# Patient Record
Sex: Female | Born: 1965 | Race: Black or African American | Hispanic: No | Marital: Single | Smoking: Current some day smoker
Health system: Southern US, Community
[De-identification: ages and names within clinical notes are randomized; demographics above are authoritative.]

## PROBLEM LIST (undated history)

## (undated) DIAGNOSIS — I639 Cerebral infarction, unspecified: Secondary | ICD-10-CM

## (undated) DIAGNOSIS — E119 Type 2 diabetes mellitus without complications: Secondary | ICD-10-CM

## (undated) HISTORY — PX: CHOLECYSTECTOMY: SHX55

---

## 2009-08-01 DIAGNOSIS — I2510 Atherosclerotic heart disease of native coronary artery without angina pectoris: Secondary | ICD-10-CM | POA: Diagnosis present

## 2009-08-12 DIAGNOSIS — E785 Hyperlipidemia, unspecified: Secondary | ICD-10-CM | POA: Diagnosis present

## 2010-03-17 DIAGNOSIS — E119 Type 2 diabetes mellitus without complications: Secondary | ICD-10-CM

## 2014-02-14 DIAGNOSIS — I10 Essential (primary) hypertension: Secondary | ICD-10-CM | POA: Diagnosis present

## 2014-09-18 DIAGNOSIS — E1142 Type 2 diabetes mellitus with diabetic polyneuropathy: Secondary | ICD-10-CM | POA: Diagnosis present

## 2014-09-18 DIAGNOSIS — K219 Gastro-esophageal reflux disease without esophagitis: Secondary | ICD-10-CM | POA: Diagnosis present

## 2017-10-06 ENCOUNTER — Emergency Department
Admission: EM | Admit: 2017-10-06 | Discharge: 2017-10-06 | Disposition: A | Discharge status: Home / Self Care | Payer: Medicaid Other | Attending: Emergency Medicine | Admitting: Emergency Medicine

## 2017-10-06 ENCOUNTER — Emergency Department: Payer: Medicaid Other

## 2017-10-06 ENCOUNTER — Other Ambulatory Visit: Payer: Self-pay

## 2017-10-06 DIAGNOSIS — Z79899 Other long term (current) drug therapy: Secondary | ICD-10-CM | POA: Insufficient documentation

## 2017-10-06 DIAGNOSIS — Z7984 Long term (current) use of oral hypoglycemic drugs: Secondary | ICD-10-CM | POA: Diagnosis not present

## 2017-10-06 DIAGNOSIS — R569 Unspecified convulsions: Secondary | ICD-10-CM | POA: Diagnosis not present

## 2017-10-06 DIAGNOSIS — R4182 Altered mental status, unspecified: Secondary | ICD-10-CM | POA: Insufficient documentation

## 2017-10-06 DIAGNOSIS — R000 Tachycardia, unspecified: Secondary | ICD-10-CM | POA: Diagnosis not present

## 2017-10-06 DIAGNOSIS — Z8673 Personal history of transient ischemic attack (TIA), and cerebral infarction without residual deficits: Secondary | ICD-10-CM | POA: Insufficient documentation

## 2017-10-06 DIAGNOSIS — Z794 Long term (current) use of insulin: Secondary | ICD-10-CM | POA: Diagnosis not present

## 2017-10-06 DIAGNOSIS — I10 Essential (primary) hypertension: Secondary | ICD-10-CM | POA: Insufficient documentation

## 2017-10-06 DIAGNOSIS — Z7902 Long term (current) use of antithrombotics/antiplatelets: Secondary | ICD-10-CM | POA: Insufficient documentation

## 2017-10-06 LAB — COMPREHENSIVE METABOLIC PANEL
ALK PHOS: 79 U/L (ref 38–126)
ALT: 22 U/L (ref 14–54)
ANION GAP: 11 (ref 5–15)
AST: 29 U/L (ref 15–41)
Albumin: 4.6 g/dL (ref 3.5–5.0)
BILIRUBIN TOTAL: 0.4 mg/dL (ref 0.3–1.2)
BUN: 14 mg/dL (ref 6–20)
CALCIUM: 10 mg/dL (ref 8.9–10.3)
CO2: 22 mmol/L (ref 22–32)
Chloride: 103 mmol/L (ref 101–111)
Creatinine, Ser: 1.05 mg/dL — ABNORMAL HIGH (ref 0.44–1.00)
GFR calc Af Amer: >60 mL/min (ref >60)
GFR calc non Af Amer: >60 mL/min (ref >60)
Glucose, Bld: 333 mg/dL — ABNORMAL HIGH (ref 65–99)
Potassium: 4.7 mmol/L (ref 3.5–5.1)
SODIUM: 136 mmol/L (ref 135–145)
TOTAL PROTEIN: 8.2 g/dL — AB (ref 6.5–8.1)

## 2017-10-06 LAB — URINALYSIS, ROUTINE W REFLEX MICROSCOPIC
BILIRUBIN URINE: NEGATIVE
Bacteria, UA: NONE SEEN
Hgb urine dipstick: NEGATIVE
KETONES UR: NEGATIVE mg/dL
LEUKOCYTES UA: NEGATIVE
NITRITE: NEGATIVE
PH: 6 (ref 5.0–8.0)
Protein, ur: NEGATIVE mg/dL
Specific Gravity, Urine: 1.016 (ref 1.005–1.030)

## 2017-10-06 LAB — URINE DRUG SCREEN, QUALITATIVE (ARMC ONLY)
Amphetamines, Ur Screen: NOT DETECTED
BARBITURATES, UR SCREEN: NOT DETECTED
BENZODIAZEPINE, UR SCRN: NOT DETECTED
Cannabinoid 50 Ng, Ur ~~LOC~~: NOT DETECTED
Cocaine Metabolite,Ur ~~LOC~~: NOT DETECTED
MDMA (Ecstasy)Ur Screen: NOT DETECTED
METHADONE SCREEN, URINE: NOT DETECTED
Opiate, Ur Screen: NOT DETECTED
Phencyclidine (PCP) Ur S: NOT DETECTED
Tricyclic, Ur Screen: POSITIVE — AB

## 2017-10-06 LAB — APTT: aPTT: 26 seconds (ref 24–36)

## 2017-10-06 LAB — DIFFERENTIAL
Basophils Absolute: 0.1 10*3/uL (ref 0–0.1)
Basophils Relative: 0 %
EOS ABS: 0.1 10*3/uL (ref 0–0.7)
Eosinophils Relative: 1 %
LYMPHS PCT: 16 %
Lymphs Abs: 2.6 10*3/uL (ref 1.0–3.6)
MONO ABS: 1 10*3/uL — AB (ref 0.2–0.9)
Monocytes Relative: 6 %
Neutro Abs: 12.9 10*3/uL — ABNORMAL HIGH (ref 1.4–6.5)
Neutrophils Relative %: 77 %

## 2017-10-06 LAB — PROTIME-INR
INR: 0.86
PROTHROMBIN TIME: 11.6 s (ref 11.4–15.2)

## 2017-10-06 LAB — CBC
HCT: 37 % (ref 35.0–47.0)
Hemoglobin: 12.2 g/dL (ref 12.0–16.0)
MCH: 27.4 pg (ref 26.0–34.0)
MCHC: 33 g/dL (ref 32.0–36.0)
MCV: 83 fL (ref 80.0–100.0)
PLATELETS: 474 10*3/uL — AB (ref 150–440)
RBC: 4.45 MIL/uL (ref 3.80–5.20)
RDW: 13 % (ref 11.5–14.5)
WBC: 16.6 10*3/uL — ABNORMAL HIGH (ref 3.6–11.0)

## 2017-10-06 LAB — TROPONIN I: Troponin I: <0.03 ng/mL (ref <0.03)

## 2017-10-06 MED ORDER — LEVETIRACETAM 500 MG/5ML IV SOLN
1000.0000 mg | Freq: Once | INTRAVENOUS | Status: AC
  Administered 2017-10-06: 1000 mg via INTRAVENOUS
  Filled 2017-10-06: qty 10

## 2017-10-06 MED ORDER — LEVETIRACETAM 500 MG PO TABS: 500.0000 mg | ORAL_TABLET | Freq: Two times a day (BID) | ORAL | 0 refills | Status: DC

## 2017-10-06 NOTE — ED Notes (Addendum)
Pt states "I know I said I would stay for observation but I want to leave when this medication is done." Pt states she does not want to be transferred and will be willing to sign and leave against medical advice. MD made aware.

## 2017-10-06 NOTE — ED Notes (Signed)
Pt assisted to toilet and [facility]. Pt is still confused. MD aware

## 2017-10-06 NOTE — ED Notes (Signed)
Waiting on medication from pharmacy

## 2017-10-06 NOTE — ED Notes (Signed)
MD Kinner at bedside  

## 2017-10-06 NOTE — ED Provider Notes (Signed)
[facility] Emergency Department Provider Note   ____________________________________________    I have reviewed the triage vital signs and the nursing notes.   HISTORY  Chief Complaint Altered mental status  Patient is unable to answer questions, due to altered mental status  HPI Linda Delacruz is a 52 y.o. female who presents after likely seizure.  Per EMS patient was at work and witnesses saw her have a generalized tonic-clonic seizure, she was mildly confused but was able to speak to them in the EMS truck but then had another seizure.  Reportedly the patient had a stroke several months ago.  Patient is unable to answer any questions she is quite confused   No past medical history on file.  There are no active problems to display for this patient.     Prior to Admission medications   Medication Sig Start Date End Date Taking? Authorizing Provider  amLODipine (NORVASC) 10 MG tablet Take 10 mg by mouth daily. 03/24/17  Yes [provider]  clopidogrel (PLAVIX) 75 MG tablet Take 75 mg by mouth daily. 06/21/17 06/21/18 Yes [provider]  DULoxetine (CYMBALTA) 60 MG capsule Take 60 mg by mouth daily. 03/24/17  Yes [provider]  insulin aspart (NOVOLOG FLEXPEN) 100 UNIT/ML FlexPen Inject 15 Units into the skin 3 (three) times daily with meals. 06/20/17  Yes [provider]  Insulin Glargine (LANTUS SOLOSTAR) 100 UNIT/ML Solostar Pen Inject 65 Units into the skin every evening. 06/20/17 06/20/18 Yes [provider]  lisinopril (PRINIVIL,ZESTRIL) 5 MG tablet Take 5 mg by mouth daily. 03/29/17 03/29/18 Yes [provider]  lovastatin (MEVACOR) 40 MG tablet Take 40 mg by mouth daily.   Yes [provider]  metFORMIN (GLUCOPHAGE) 500 MG tablet Take 500 mg by mouth 2 (two) times daily. 03/24/17  Yes [provider]  metoprolol succinate (TOPROL-XL) 25 MG 24 hr tablet Take 1 tablet by mouth  daily. 03/24/17 03/24/18 Yes [provider]  nortriptyline (PAMELOR) 10 MG capsule Take 1 capsule by mouth at bedtime. 03/24/17 03/24/18 Yes [provider]  traZODone (DESYREL) 50 MG tablet Take 50 mg by mouth at bedtime as needed for sleep.   Yes [provider]     Allergies Patient has no known allergies.  No family history on file.  Social History Social History   Tobacco Use  . Smoking status: Not on file  Substance Use Topics  . Alcohol use: Not on file  . Drug use: Not on file    Review of Systems  Constitutional: No fever/chills Eyes: No visual changes.  ENT: No sore throat. Cardiovascular: Denies chest pain. Respiratory: Denies shortness of breath. Gastrointestinal: No abdominal pain.  No nausea, no vomiting.   Genitourinary: Negative for dysuria. Musculoskeletal: Negative for back pain. Skin: Negative for rash. Neurological: Negative for headaches or weakness   ____________________________________________   PHYSICAL EXAM:  VITAL SIGNS: ED Triage Vitals  Enc Vitals Group     BP 10/06/17 1243 (!) 172/89     Pulse Rate 10/06/17 1243 100     Resp 10/06/17 1243 (!) 21     Temp 10/06/17 1243 98.1 F (36.7 C)     Temp Source 10/06/17 1243 Axillary     SpO2 10/06/17 1243 100 %     Weight 10/06/17 1247 74.8 kg (165 lb)     Height 10/06/17 1247 1.651 m (5\' 5" )     Head Circumference --      Peak Flow --  Pain Score --      Pain Loc --      Pain Edu? --      Excl. in GC? --     Constitutional: Patient confused, not answering questions Eyes: Conjunctivae are normal.   Nose: No congestion/rhinnorhea. Mouth/Throat: Mucous membranes are moist.    Cardiovascular: Tachycardia grossly normal heart sounds.  Good peripheral circulation. Respiratory: Normal respiratory effort.  No retractions. Lungs CTAB. Gastrointestinal: Soft and nontender. No distention.  No CVA tenderness. Genitourinary: deferred Musculoskeletal:   Warm and  well perfused Neurologic: Patient looking off to the right, appears to move all extremities equally, PERRLA Skin:  Skin is warm, dry and intact. No rash noted. Psychiatric: unAble to examine  ____________________________________________   LABS (all labs ordered are listed, but only abnormal results are displayed)  Labs Reviewed  CBC - Abnormal; Notable for the following components:      Result Value   WBC 16.6 (*)    Platelets 474 (*)    All other components within normal limits  DIFFERENTIAL - Abnormal; Notable for the following components:   Neutro Abs 12.9 (*)    Monocytes Absolute 1.0 (*)    All other components within normal limits  COMPREHENSIVE METABOLIC PANEL - Abnormal; Notable for the following components:   Glucose, Bld 333 (*)    Creatinine, Ser 1.05 (*)    Total Protein 8.2 (*)    All other components within normal limits  URINE DRUG SCREEN, QUALITATIVE ([facility] ONLY) - Abnormal; Notable for the following components:   Tricyclic, Ur Screen POSITIVE (*)    All other components within normal limits  URINALYSIS, ROUTINE W REFLEX MICROSCOPIC - Abnormal; Notable for the following components:   Color, Urine YELLOW (*)    APPearance CLOUDY (*)    Glucose, UA >=500 (*)    Squamous Epithelial / LPF 0-5 (*)    All other components within normal limits  PROTIME-INR  APTT  TROPONIN I   ____________________________________________  EKG  ED ECG REPORT I, Jene Every, the attending physician, personally viewed and interpreted this ECG.  Date: 10/06/2017  Rhythm: Sinus tachycardia QRS Axis: normal Intervals: normal ST/T Wave abnormalities: normal Narrative Interpretation: no evidence of acute ischemia  ____________________________________________  RADIOLOGY  CT head unremarkable ____________________________________________   PROCEDURES  Procedure(s) performed: No  Procedures   Critical Care performed:  No ____________________________________________   INITIAL IMPRESSION / ASSESSMENT AND PLAN / ED COURSE  Pertinent labs & imaging results that were available during my care of the patient were reviewed by me and considered in my medical decision making (see chart for details).  Patient with new onset seizure, CT scan unremarkable, lab work overall reassuring, mildly elevated white blood cell count likely related to seizure.  Discussed with patient admission to the hospital for observation and she agrees    ----------------------------------------- 2:54 PM on 10/06/2017 -----------------------------------------  Patient is now requesting to leave AMA.  She notes that she "feels fine "does not want to stay in the hospital.  I discussed with her that I think leaving is dangerous because she can have another seizure at any time which could leave her maimed or even dead.  She accepts this responsibility.  She knows she can return anytime  10/06/2017 at 3:03 PM:  The patient requested to leave.  I considered this to be leaving against medical advice. I personally discussed the following with them:  1)  That they currently had a medical condition of seizure and I am concerned that  they may have another seizure.    2)  My proposed course of evaluation and treatment includes, but is not limited to, IV Keppra and admission.    3) Risks of leaving before this had been completed include: misdiagnosis, worsening illness leading up to and including prolonged or permanent disability or death.  Specific risks pertinent, but not all inclusive, of their current medical condition include but are not limited to seizure, aspiration, head injury.    Despite this they stated they wanted to leave due to feeling better and generally not wanting to stay in the hospital and refused further evaluation, treatment, or admission at this time.   They appeared clinically sober, were mentating appropriately, were free  from distracting injury, had adequately controlled acute pain, appeared to have intact insight, judgment, and reason, and in my opinion had the capacity to make this decision.  Specifically, they were able to verbally state back in a coherent manner their current medical condition/current diagnosis, the proposed course of evaluation and/or treatment, and the risks, benefits, and alternatives of treatment versus leaving against medical advice.   They understand that they may return to seek medical attention here at ANY time they want.  I strongly advised them to return to the Emergency Department immediately if they experience any new or worsening symptoms that concern them, or simply if they reconsider continued evaluation and/or treatment as previously discussed.  This would be without any repercussions, though they understand they likely will need to wait again in the Emergency Department if other patients are in front of them, rather than being brought straight back.  They understood this is another advantage of staying, but still insisted upon leaving.  I recommended they follow-up with neurology at the earliest available opportunity/appointment for further evaluation and treatment.   The patient was discharged against medical advice.  They did accept written discharge instructions.      ____________________________________________   FINAL CLINICAL IMPRESSION(S) / ED DIAGNOSES  Final diagnoses:  Seizure (HCC)        Note:  This document was prepared using Dragon voice recognition software and may include unintentional dictation errors.    Jene Every, MD 10/06/17 1504

## 2017-10-06 NOTE — ED Triage Notes (Signed)
Pt comes via [facility] from work following a seizure. Pt was witnessed by coworkers having a seizure. EMS states pt did have full seizure with them for about 10-15s. Pt is altered at this time with right sided gaze. BS- 345, BP 190/100. Pt is alert but mumbling at this time

## 2017-10-06 NOTE — Progress Notes (Signed)
Chaplain was referred by on call chaplain for a code stroke. Doctor said if was a mistake  No family was present. Pt went to CT. Chaplain informed front dest that family can go back.    10/06/17 1300  Clinical Encounter Type  Visited With Patient  Visit Type Initial  Referral From Nurse  Spiritual Encounters  Spiritual Needs Emotional

## 2018-12-11 DIAGNOSIS — Z8673 Personal history of transient ischemic attack (TIA), and cerebral infarction without residual deficits: Secondary | ICD-10-CM

## 2020-10-20 DIAGNOSIS — F339 Major depressive disorder, recurrent, unspecified: Secondary | ICD-10-CM | POA: Diagnosis present

## 2021-04-03 ENCOUNTER — Emergency Department: Payer: BLUE CROSS/BLUE SHIELD

## 2021-04-03 ENCOUNTER — Encounter: Payer: Self-pay | Admitting: Emergency Medicine

## 2021-04-03 ENCOUNTER — Observation Stay
Admission: EM | Admit: 2021-04-03 | Discharge: 2021-04-05 | Disposition: A | Discharge status: Home / Self Care | Payer: BLUE CROSS/BLUE SHIELD | Attending: Internal Medicine | Admitting: Internal Medicine

## 2021-04-03 ENCOUNTER — Other Ambulatory Visit: Payer: Self-pay

## 2021-04-03 DIAGNOSIS — Z7984 Long term (current) use of oral hypoglycemic drugs: Secondary | ICD-10-CM | POA: Insufficient documentation

## 2021-04-03 DIAGNOSIS — Z8673 Personal history of transient ischemic attack (TIA), and cerebral infarction without residual deficits: Secondary | ICD-10-CM

## 2021-04-03 DIAGNOSIS — K219 Gastro-esophageal reflux disease without esophagitis: Secondary | ICD-10-CM

## 2021-04-03 DIAGNOSIS — I10 Essential (primary) hypertension: Secondary | ICD-10-CM

## 2021-04-03 DIAGNOSIS — Z20822 Contact with and (suspected) exposure to covid-19: Secondary | ICD-10-CM | POA: Diagnosis not present

## 2021-04-03 DIAGNOSIS — I2510 Atherosclerotic heart disease of native coronary artery without angina pectoris: Secondary | ICD-10-CM | POA: Diagnosis not present

## 2021-04-03 DIAGNOSIS — E1142 Type 2 diabetes mellitus with diabetic polyneuropathy: Secondary | ICD-10-CM

## 2021-04-03 DIAGNOSIS — Z79899 Other long term (current) drug therapy: Secondary | ICD-10-CM | POA: Insufficient documentation

## 2021-04-03 DIAGNOSIS — E1165 Type 2 diabetes mellitus with hyperglycemia: Secondary | ICD-10-CM | POA: Insufficient documentation

## 2021-04-03 DIAGNOSIS — R4182 Altered mental status, unspecified: Principal | ICD-10-CM

## 2021-04-03 DIAGNOSIS — E785 Hyperlipidemia, unspecified: Secondary | ICD-10-CM

## 2021-04-03 DIAGNOSIS — F339 Major depressive disorder, recurrent, unspecified: Secondary | ICD-10-CM

## 2021-04-03 DIAGNOSIS — R29818 Other symptoms and signs involving the nervous system: Secondary | ICD-10-CM | POA: Diagnosis not present

## 2021-04-03 DIAGNOSIS — N179 Acute kidney failure, unspecified: Secondary | ICD-10-CM | POA: Insufficient documentation

## 2021-04-03 DIAGNOSIS — Y900 Blood alcohol level of less than 20 mg/100 ml: Secondary | ICD-10-CM | POA: Diagnosis not present

## 2021-04-03 DIAGNOSIS — E119 Type 2 diabetes mellitus without complications: Secondary | ICD-10-CM

## 2021-04-03 DIAGNOSIS — Z7982 Long term (current) use of aspirin: Secondary | ICD-10-CM | POA: Insufficient documentation

## 2021-04-03 DIAGNOSIS — Z794 Long term (current) use of insulin: Secondary | ICD-10-CM

## 2021-04-03 DIAGNOSIS — R739 Hyperglycemia, unspecified: Secondary | ICD-10-CM

## 2021-04-03 HISTORY — DX: Type 2 diabetes mellitus without complications: E11.9

## 2021-04-03 HISTORY — DX: Cerebral infarction, unspecified: I63.9

## 2021-04-03 LAB — TROPONIN I (HIGH SENSITIVITY): Troponin I (High Sensitivity): 7 ng/L (ref <18)

## 2021-04-03 LAB — DIFFERENTIAL
Abs Immature Granulocytes: 0.12 10*3/uL — ABNORMAL HIGH (ref 0.00–0.07)
Basophils Absolute: 0.1 10*3/uL (ref 0.0–0.1)
Basophils Relative: 1 %
Eosinophils Absolute: 0.3 10*3/uL (ref 0.0–0.5)
Eosinophils Relative: 2 %
Immature Granulocytes: 1 %
Lymphocytes Relative: 18 %
Lymphs Abs: 2.7 10*3/uL (ref 0.7–4.0)
Monocytes Absolute: 1 10*3/uL (ref 0.1–1.0)
Monocytes Relative: 7 %
Neutro Abs: 10.8 10*3/uL — ABNORMAL HIGH (ref 1.7–7.7)
Neutrophils Relative %: 71 %

## 2021-04-03 LAB — COMPREHENSIVE METABOLIC PANEL
ALT: 13 U/L (ref 0–44)
AST: 22 U/L (ref 15–41)
Albumin: 4 g/dL (ref 3.5–5.0)
Alkaline Phosphatase: 113 U/L (ref 38–126)
Anion gap: 8 (ref 5–15)
BUN: 25 mg/dL — ABNORMAL HIGH (ref 6–20)
CO2: 27 mmol/L (ref 22–32)
Calcium: 9.6 mg/dL (ref 8.9–10.3)
Chloride: 96 mmol/L — ABNORMAL LOW (ref 98–111)
Creatinine, Ser: 1.83 mg/dL — ABNORMAL HIGH (ref 0.44–1.00)
GFR, Estimated: 32 mL/min — ABNORMAL LOW (ref >60)
Glucose, Bld: 343 mg/dL — ABNORMAL HIGH (ref 70–99)
Potassium: 4.5 mmol/L (ref 3.5–5.1)
Sodium: 131 mmol/L — ABNORMAL LOW (ref 135–145)
Total Bilirubin: 0.4 mg/dL (ref 0.3–1.2)
Total Protein: 7.9 g/dL (ref 6.5–8.1)

## 2021-04-03 LAB — PROTIME-INR
INR: 0.9 (ref 0.8–1.2)
Prothrombin Time: 12 seconds (ref 11.4–15.2)

## 2021-04-03 LAB — CBC
HCT: 34.3 % — ABNORMAL LOW (ref 36.0–46.0)
Hemoglobin: 11.4 g/dL — ABNORMAL LOW (ref 12.0–15.0)
MCH: 28.2 pg (ref 26.0–34.0)
MCHC: 33.2 g/dL (ref 30.0–36.0)
MCV: 84.9 fL (ref 80.0–100.0)
Platelets: 447 10*3/uL — ABNORMAL HIGH (ref 150–400)
RBC: 4.04 MIL/uL (ref 3.87–5.11)
RDW: 12.6 % (ref 11.5–15.5)
WBC: 15 10*3/uL — ABNORMAL HIGH (ref 4.0–10.5)
nRBC: 0 % (ref 0.0–0.2)

## 2021-04-03 LAB — APTT: aPTT: 24 seconds (ref 24–36)

## 2021-04-03 LAB — ETHANOL: Alcohol, Ethyl (B): <10 mg/dL (ref <10)

## 2021-04-03 LAB — CBG MONITORING, ED
Glucose-Capillary: 329 mg/dL — ABNORMAL HIGH (ref 70–99)
Glucose-Capillary: 211 mg/dL — ABNORMAL HIGH (ref 70–99)
Glucose-Capillary: 104 mg/dL — ABNORMAL HIGH (ref 70–99)

## 2021-04-03 LAB — RESP PANEL BY RT-PCR (FLU A&B, COVID) ARPGX2
Influenza A by PCR: NEGATIVE
Influenza B by PCR: NEGATIVE
SARS Coronavirus 2 by RT PCR: NEGATIVE

## 2021-04-03 LAB — BETA-HYDROXYBUTYRIC ACID: Beta-Hydroxybutyric Acid: 0.07 mmol/L (ref 0.05–0.27)

## 2021-04-03 LAB — MAGNESIUM: Magnesium: 2.2 mg/dL (ref 1.7–2.4)

## 2021-04-03 MED ORDER — LORAZEPAM 2 MG/ML IJ SOLN: 2.0000 mg | Freq: Every day | INTRAMUSCULAR | Status: DC | PRN

## 2021-04-03 MED ORDER — ENOXAPARIN SODIUM 40 MG/0.4ML IJ SOSY
40.0000 mg | PREFILLED_SYRINGE | INTRAMUSCULAR | Status: DC
  Administered 2021-04-03 – 2021-04-04 (×2): 40 mg via SUBCUTANEOUS
  Filled 2021-04-03 (×2): qty 0.4

## 2021-04-03 MED ORDER — SODIUM CHLORIDE 0.9% FLUSH
3.0000 mL | Freq: Two times a day (BID) | INTRAVENOUS | Status: DC
  Administered 2021-04-03 – 2021-04-04 (×2): 3 mL via INTRAVENOUS

## 2021-04-03 MED ORDER — INSULIN ASPART 100 UNIT/ML IJ SOLN
0.0000 [IU] | Freq: Three times a day (TID) | INTRAMUSCULAR | Status: DC
  Administered 2021-04-04: 5 [IU] via SUBCUTANEOUS
  Administered 2021-04-05: 15 [IU] via SUBCUTANEOUS
  Filled 2021-04-03 (×3): qty 1

## 2021-04-03 MED ORDER — AMLODIPINE BESYLATE 10 MG PO TABS
10.0000 mg | ORAL_TABLET | Freq: Every day | ORAL | Status: DC
  Administered 2021-04-04 – 2021-04-05 (×2): 10 mg via ORAL
  Filled 2021-04-03 (×2): qty 1

## 2021-04-03 MED ORDER — INSULIN ASPART 100 UNIT/ML IJ SOLN
0.0000 [IU] | INTRAMUSCULAR | Status: DC
  Administered 2021-04-03: 11 [IU] via SUBCUTANEOUS
  Filled 2021-04-03: qty 1

## 2021-04-03 MED ORDER — LACTATED RINGERS IV BOLUS
1000.0000 mL | Freq: Once | INTRAVENOUS | Status: AC
  Administered 2021-04-03: 1000 mL via INTRAVENOUS

## 2021-04-03 MED ORDER — IOHEXOL 350 MG/ML SOLN: 75.0000 mL | Freq: Once | INTRAVENOUS | Status: DC | PRN

## 2021-04-03 MED ORDER — INSULIN ASPART 100 UNIT/ML IJ SOLN
8.0000 [IU] | Freq: Three times a day (TID) | INTRAMUSCULAR | Status: DC
  Administered 2021-04-04: 8 [IU] via SUBCUTANEOUS
  Filled 2021-04-03: qty 1

## 2021-04-03 MED ORDER — ACETAMINOPHEN 650 MG RE SUPP: 650.0000 mg | Freq: Four times a day (QID) | RECTAL | Status: DC | PRN

## 2021-04-03 MED ORDER — INSULIN ASPART PROT & ASPART (70-30 MIX) 100 UNIT/ML ~~LOC~~ SUSP
20.0000 [IU] | Freq: Every day | SUBCUTANEOUS | Status: DC
  Administered 2021-04-04: 20 [IU] via SUBCUTANEOUS
  Filled 2021-04-03 (×2): qty 10

## 2021-04-03 MED ORDER — METOPROLOL TARTRATE 25 MG PO TABS
25.0000 mg | ORAL_TABLET | Freq: Two times a day (BID) | ORAL | Status: DC
  Administered 2021-04-03 – 2021-04-05 (×4): 25 mg via ORAL
  Filled 2021-04-03 (×4): qty 1

## 2021-04-03 MED ORDER — ALBUTEROL SULFATE (2.5 MG/3ML) 0.083% IN NEBU: 2.5000 mg | INHALATION_SOLUTION | RESPIRATORY_TRACT | Status: DC | PRN

## 2021-04-03 MED ORDER — ACETAMINOPHEN 325 MG PO TABS: 650.0000 mg | ORAL_TABLET | Freq: Four times a day (QID) | ORAL | Status: DC | PRN

## 2021-04-03 MED ORDER — FLUOXETINE HCL 20 MG PO CAPS
40.0000 mg | ORAL_CAPSULE | Freq: Every day | ORAL | Status: DC
  Administered 2021-04-04 – 2021-04-05 (×2): 40 mg via ORAL
  Filled 2021-04-03 (×2): qty 2

## 2021-04-03 MED ORDER — GABAPENTIN 300 MG PO CAPS
300.0000 mg | ORAL_CAPSULE | Freq: Two times a day (BID) | ORAL | Status: DC
  Administered 2021-04-03 – 2021-04-05 (×4): 300 mg via ORAL
  Filled 2021-04-03 (×4): qty 1

## 2021-04-03 MED ORDER — PANTOPRAZOLE SODIUM 40 MG PO TBEC
40.0000 mg | DELAYED_RELEASE_TABLET | Freq: Every day | ORAL | Status: DC
  Administered 2021-04-04 – 2021-04-05 (×2): 40 mg via ORAL
  Filled 2021-04-03 (×2): qty 1

## 2021-04-03 MED ORDER — INSULIN ASPART PROT & ASPART (70-30 MIX) 100 UNIT/ML ~~LOC~~ SUSP: 30.0000 [IU] | Freq: Every day | SUBCUTANEOUS | Status: DC

## 2021-04-03 MED ORDER — ASPIRIN EC 81 MG PO TBEC
81.0000 mg | DELAYED_RELEASE_TABLET | Freq: Every day | ORAL | Status: DC
  Administered 2021-04-04 – 2021-04-05 (×2): 81 mg via ORAL
  Filled 2021-04-03 (×2): qty 1

## 2021-04-03 NOTE — H&P (Signed)
History and Physical   Linda Delacruz DOB: 03/15/1966 DOA: 04/03/2021  PCP: [facility]   Patient coming from: Work/EMS  Chief Complaint: Altered mental status  HPI: Linda Delacruz is a 55 y.o. female with medical history significant of CVA, diabetes, hyperlipidemia, hypertension, GERD, depression, coronary atherosclerosis presents after episode of altered mental status. Patient was alert and oriented when I saw her and was able to assist in providing history.  Further history obtained with chart review.  EMS was called out apparently for a glucose related issue initially, patient states that she was feeling off when she got to work and she went to the bathroom first thing got up and went to get a [facility] as she thought she may have had low blood sugar then she said the next remembers was EMS being there.  When EMS arrived patient was unable to stand and she actually had high blood sugar.  She then had an episode of unresponsiveness and was subsequently nonverbal afterwards when she became more alert.  She was able to say some words and interact some here and then had another episode of unresponsiveness when taken MRI and had temporarily a week bradycardic pulse per reports.  Now back to be able to answer yes or no questions but not at her baseline. As above, patient is back to her baseline release near at with a degree of stutter and some mild weakness when seen.  Otherwise she is alert and oriented and appropriate. Patient reportedly had a seizure-like activity about a year and a half ago but received only a dose of Keppra at the ED for this and then left AMA as she was feeling back to baseline.  She was prescribed Keppra outpatient per chart review but she states she never picked it up and never took any.  No episodes since.  This appears to be to be different episodes with tonic-clonic activity compared to her episodes of unresponsiveness and altered most here. She  reports some elevated blood sugars recently to the point where her glucometer read "high "and also some intermittent leg cramping.  She states that at her baseline she has no stuttering and no weakness and she is fully recovered from her previous CVA  Patient denies fevers, chills, chest pain, shortness of breath, abdominal pain, constipation, diarrhea, nausea, vomiting and  ED Course: Vital signs in the ED stable.  Lab work-up showed CMP with sodium 131 which corrects to 135 considering glucose of 343, chloride 96, BUN 25, creatinine elevated 1.3 from baseline of around 1.4.  CBC showed mild leukocytosis to 15 however this appears to be chronic for her, hemoglobin 11.4, platelets 447.  PT, PTT, INR within normal limits.  Ethanol level negative, beta hydroxybutyric acid normal.  Keppra level pending, troponin pending, urinalysis, UDS, urine pregnancy test pending.  Respiratory panel for flu and COVID-negative.  Imaging work-up showed chest x-ray with some mild bronchitic changes, CT head without acute normality, MR brain without acute abnormality but does show changes consistent with small vessel disease however changes are nonspecific also shows evidence of known prior chronic infarct.  MRI showed no acute large vessel ischemia however does show moderate stenosis of the vessels of right RCA, right MCA, left PCA with a location with a questionable aneurysm.  Patient was seen by neurology in the ED.  Patient received 2 L of IV fluids and started on insulin for now.  Review of Systems: As per HPI otherwise all other systems reviewed and  are negative.  Past Medical History:  Diagnosis Date   Diabetes mellitus without complication (HCC)    Stroke [facility])     History reviewed. No pertinent surgical history.  Social History  has no history on file for tobacco use, alcohol use, and drug use.  No Known Allergies  Family History  Problem Relation Age of Onset   Seizures Neg Hx    Prior to Admission  medications   Medication Sig Start Date End Date Taking? Authorizing Provider  amLODipine (NORVASC) 10 MG tablet Take 10 mg by mouth daily. 03/24/17  Yes [provider]  aspirin 81 MG EC tablet Take by mouth. 12/10/18  Yes [provider]  DULoxetine (CYMBALTA) 60 MG capsule Take 60 mg by mouth daily. 03/24/17  Yes [provider]  escitalopram (LEXAPRO) 10 MG tablet Take 10 mg by mouth daily. 02/04/21  Yes [provider]  FLUoxetine (PROZAC) 40 MG capsule Take 40 mg by mouth daily as needed. 04/01/21  Yes [provider]  furosemide (LASIX) 20 MG tablet Take 20 mg by mouth daily. 11/12/20  Yes [provider]  gabapentin (NEURONTIN) 300 MG capsule Take by mouth 2 (two) times daily. 02/20/21  Yes [provider]  hydrochlorothiazide (HYDRODIURIL) 25 MG tablet Take by mouth.   Yes [provider]  insulin aspart (NOVOLOG) 100 UNIT/ML FlexPen Inject 15 Units into the skin 3 (three) times daily with meals. 06/20/17  Yes [provider]  insulin isophane & regular human KwikPen (HUMULIN 70/30 KWIKPEN) (70-30) 100 UNIT/ML KwikPen Take 20 units at breakfast and 10 units at dinner 12/28/19  Yes [provider]  levETIRAcetam (KEPPRA) 500 MG tablet Take 1 tablet (500 mg total) by mouth 2 (two) times daily. 10/06/17  Yes Jene Every, MD  LORazepam (ATIVAN) 0.5 MG tablet Take 0.5 mg by mouth 3 (three) times daily as needed. 01/08/21  Yes [provider]  lovastatin (MEVACOR) 40 MG tablet Take 40 mg by mouth daily.   Yes [provider]  meloxicam (MOBIC) 15 MG tablet Take by mouth. 04/18/19  Yes [provider]  metoprolol tartrate (LOPRESSOR) 25 MG tablet Take 25 mg by mouth 2 (two) times daily. 12/13/20  Yes [provider]  omeprazole (PRILOSEC) 20 MG capsule Take by mouth.   Yes [provider]  spironolactone (ALDACTONE) 50 MG tablet Take 50 mg by mouth daily. 10/20/20  Yes [provider]  Insulin Glargine (LANTUS SOLOSTAR) 100 UNIT/ML Solostar Pen Inject 65 Units into the skin every evening. 06/20/17 06/20/18  [provider]  lisinopril (PRINIVIL,ZESTRIL) 5 MG tablet Take 5 mg by mouth daily. 03/29/17 03/29/18  [provider]  metFORMIN (GLUCOPHAGE) 500 MG tablet Take 500 mg by mouth 2 (two) times daily. Patient not taking: No sig reported 03/24/17   [provider]  metoprolol succinate (TOPROL-XL) 25 MG 24 hr tablet Take 1 tablet by mouth daily. 03/24/17 03/24/18  [provider]  nortriptyline (PAMELOR) 10 MG capsule Take 1 capsule by mouth at bedtime. 03/24/17 03/24/18  [provider]  traZODone (DESYREL) 50 MG tablet Take 50 mg by mouth at bedtime as needed for sleep. Patient not taking: No sig reported    [provider]    Physical Exam: Vitals:   04/03/21 1730 04/03/21 1800 04/03/21 1830 04/03/21 1900  BP: (!) 154/79 (!) 143/77 (!) 160/77 (!) 157/79  Pulse: 67 71 72 70  Resp: 19 14 15 16   Temp:      TempSrc:  SpO2: 97% 100% 100% 99%  Weight:       Physical Exam Constitutional:      General: She is not in acute distress.    Appearance: Normal appearance.  HENT:     Head: Normocephalic and atraumatic.     Mouth/Throat:     Mouth: Mucous membranes are moist.     Pharynx: Oropharynx is clear.  Eyes:     Extraocular Movements: Extraocular movements intact.     Pupils: Pupils are equal, round, and reactive to light.  Cardiovascular:     Rate and Rhythm: Normal rate and regular rhythm.     Pulses: Normal pulses.     Heart sounds: Normal heart sounds.  Pulmonary:     Effort: Pulmonary effort is normal. No respiratory distress.     Breath sounds: Normal breath sounds.  Abdominal:     General: Bowel sounds are normal. There is no distension.     Palpations: Abdomen is soft.     Tenderness: There is no abdominal tenderness.  Musculoskeletal:        General: No swelling or deformity.   Skin:    General: Skin is warm and dry.  Neurological:     Comments: Mental Status: Patient is awake, alert, oriented x3 No signs of aphasia or neglect, now with only mild intermittent stutter. Cranial Nerves: II: Pupils equal, round, and reactive to light.   III,IV, VI: EOMI without ptosis or diploplia.  V: Facial sensation is symmetric to light touch. VII: Facial movement is symmetric.  VIII: hearing is intact to voice X: Uvula elevates symmetrically XI: Shoulder shrug is symmetric. XII: tongue is midline without atrophy or fasciculations.  Motor: Decent effort thorughout, at Least 5/5 bilateral UE, 5/5 left lower extremity, 4-5/5 right lower extremity.  Did have mildly reduced grip strength, possibly a component of decreased effort. Sensory: Sensation is grossly intact  bilateral UEs & LEs    Labs on Admission: I have personally reviewed following labs and imaging studies  CBC: Recent Labs  Lab 04/03/21 1527  WBC 15.0*  NEUTROABS 10.8*  HGB 11.4*  HCT 34.3*  MCV 84.9  PLT 447*    Basic Metabolic Panel: Recent Labs  Lab 04/03/21 1527  NA 131*  K 4.5  CL 96*  CO2 27  GLUCOSE 343*  BUN 25*  CREATININE 1.83*  CALCIUM 9.6    GFR: CrCl cannot be calculated (Unknown ideal weight.).  Liver Function Tests: Recent Labs  Lab 04/03/21 1527  AST 22  ALT 13  ALKPHOS 113  BILITOT 0.4  PROT 7.9  ALBUMIN 4.0    Urine analysis:    Component Value Date/Time   COLORURINE YELLOW (A) 10/06/2017 1238   APPEARANCEUR CLOUDY (A) 10/06/2017 1238   LABSPEC 1.016 10/06/2017 1238   PHURINE 6.0 10/06/2017 1238   GLUCOSEU >=500 (A) 10/06/2017 1238   HGBUR NEGATIVE 10/06/2017 1238   BILIRUBINUR NEGATIVE 10/06/2017 1238   KETONESUR NEGATIVE 10/06/2017 1238   PROTEINUR NEGATIVE 10/06/2017 1238   NITRITE NEGATIVE 10/06/2017 1238   LEUKOCYTESUR NEGATIVE 10/06/2017 1238    Radiological Exams on Admission: MR ANGIO HEAD WO CONTRAST  Result Date: 04/03/2021 CLINICAL  DATA:  Neuro deficit, acute, stroke suspected. EXAM: MRI HEAD WITHOUT CONTRAST MRA HEAD WITHOUT CONTRAST TECHNIQUE: Multiplanar, multi-echo pulse sequences of the brain and surrounding structures were acquired without intravenous contrast. Angiographic images of the Circle of Willis were acquired using MRA technique without intravenous contrast. COMPARISON:  Noncontrast head CT performed earlier today 04/03/2021. FINDINGS: MRI HEAD  FINDINGS Brain: The following protocol was performed at the ordering provider's request: Axial coronal diffusion-weighted imaging, axial T2/FLAIR sequence, axial SWI sequence, coronal and coronal T2/FLAIR sequences oriented perpendicular to the long axis of the hippocampi. Mild generalized cerebral and cerebellar atrophy. Redemonstrated chronic lacunar infarct within the right basal ganglia/anterior limb of right internal capsule. Moderate multifocal T2/FLAIR hyperintensity within the cerebral white matter and pons, nonspecific but most often secondary to chronic small vessel ischemia. The hippocampi are symmetric in size and signal. There is no acute infarct. No evidence of an intracranial mass. No chronic intracranial blood products. No extra-axial fluid collection. No midline shift. Vascular: Reported below. Skull and upper cervical spine: No focal suspicious marrow lesion is identified on the acquired sequences. Sinuses/Orbits: Visualized orbits show no acute finding. No significant paranasal sinus disease. MRA HEAD FINDINGS Anterior circulation: Mildly motion degraded examination. The intracranial internal carotid arteries are patent. Atherosclerotic irregularity of both vessels. Up to moderate stenosis within the cavernous right ICA. Apparent symmetric narrowing of the bilateral internal carotid arteries at the level of the skull base is likely artifactual. The M1 middle cerebral arteries are patent. Atherosclerotic irregularity of the M2 and more distal middle cerebral artery  vessels bilaterally. Most notably, there is a severe stenosis within a superior division proximal M2 right MCA vessel (series 1034, image 6). Also of note, there is up to moderate stenosis within a proximal M2 left MCA vessel (series 1034, image 14). The anterior cerebral arteries are patent. Mild stenosis within the proximal A1 right anterior cerebral artery. 2 mm inferiorly projecting vascular protrusion arising from the cavernous left ICA, which may reflect an aneurysm (series 1034, image 18). Posterior circulation: The intracranial vertebral arteries are patent. The basilar artery is patent. The posterior cerebral arteries are patent. Atherosclerotic irregularity of both vessels. Most notably, there is a moderate/severe focal stenosis within the distal P2 left PCA (series 1050, image 3). Hypoplastic right P1 segment with sizable right posterior communicating artery. A left posterior communicating artery is also present. Anatomic variants: As described IMPRESSION: MRI brain: 1. No evidence of acute intracranial abnormality. 2. No specific seizure focus is identified. 3. Moderate T2/FLAIR hyperintense signal changes within the cerebral white matter and pons, nonspecific but most often secondary to chronic small vessel ischemia. 4. Redemonstrated chronic lacunar infarct within the right basal ganglia/anterior limb of right internal capsule. 5. Mild generalized parenchymal atrophy. MRA head: 1. Mildly motion degraded exam. 2. No intracranial large vessel occlusion is identified. 3. Intracranial atherosclerotic disease multifocal stenoses, most notably as follows. 4. Up to moderate stenosis within the cavernous right ICA. 5. Moderate/severe stenosis within a superior division proximal M2 right MCA vessel. 6. Moderate stenosis within a superior division proximal M2 left MCA vessel. 7. Moderate/severe focal stenosis within the distal P2 segment of the left posterior cerebral artery. 8. 2 mm inferiorly projecting  vascular protrusion arising from the cavernous left ICA, which may reflect an aneurysm. Electronically Signed   By: Jackey LogeKyle  Golden DO   On: 04/03/2021 16:52   MR BRAIN WO CONTRAST  Result Date: 04/03/2021 CLINICAL DATA:  Neuro deficit, acute, stroke suspected. EXAM: MRI HEAD WITHOUT CONTRAST MRA HEAD WITHOUT CONTRAST TECHNIQUE: Multiplanar, multi-echo pulse sequences of the brain and surrounding structures were acquired without intravenous contrast. Angiographic images of the Circle of Willis were acquired using MRA technique without intravenous contrast. COMPARISON:  Noncontrast head CT performed earlier today 04/03/2021. FINDINGS: MRI HEAD FINDINGS Brain: The following protocol was performed at the ordering provider's request: Axial  coronal diffusion-weighted imaging, axial T2/FLAIR sequence, axial SWI sequence, coronal and coronal T2/FLAIR sequences oriented perpendicular to the long axis of the hippocampi. Mild generalized cerebral and cerebellar atrophy. Redemonstrated chronic lacunar infarct within the right basal ganglia/anterior limb of right internal capsule. Moderate multifocal T2/FLAIR hyperintensity within the cerebral white matter and pons, nonspecific but most often secondary to chronic small vessel ischemia. The hippocampi are symmetric in size and signal. There is no acute infarct. No evidence of an intracranial mass. No chronic intracranial blood products. No extra-axial fluid collection. No midline shift. Vascular: Reported below. Skull and upper cervical spine: No focal suspicious marrow lesion is identified on the acquired sequences. Sinuses/Orbits: Visualized orbits show no acute finding. No significant paranasal sinus disease. MRA HEAD FINDINGS Anterior circulation: Mildly motion degraded examination. The intracranial internal carotid arteries are patent. Atherosclerotic irregularity of both vessels. Up to moderate stenosis within the cavernous right ICA. Apparent symmetric narrowing of the  bilateral internal carotid arteries at the level of the skull base is likely artifactual. The M1 middle cerebral arteries are patent. Atherosclerotic irregularity of the M2 and more distal middle cerebral artery vessels bilaterally. Most notably, there is a severe stenosis within a superior division proximal M2 right MCA vessel (series 1034, image 6). Also of note, there is up to moderate stenosis within a proximal M2 left MCA vessel (series 1034, image 14). The anterior cerebral arteries are patent. Mild stenosis within the proximal A1 right anterior cerebral artery. 2 mm inferiorly projecting vascular protrusion arising from the cavernous left ICA, which may reflect an aneurysm (series 1034, image 18). Posterior circulation: The intracranial vertebral arteries are patent. The basilar artery is patent. The posterior cerebral arteries are patent. Atherosclerotic irregularity of both vessels. Most notably, there is a moderate/severe focal stenosis within the distal P2 left PCA (series 1050, image 3). Hypoplastic right P1 segment with sizable right posterior communicating artery. A left posterior communicating artery is also present. Anatomic variants: As described IMPRESSION: MRI brain: 1. No evidence of acute intracranial abnormality. 2. No specific seizure focus is identified. 3. Moderate T2/FLAIR hyperintense signal changes within the cerebral white matter and pons, nonspecific but most often secondary to chronic small vessel ischemia. 4. Redemonstrated chronic lacunar infarct within the right basal ganglia/anterior limb of right internal capsule. 5. Mild generalized parenchymal atrophy. MRA head: 1. Mildly motion degraded exam. 2. No intracranial large vessel occlusion is identified. 3. Intracranial atherosclerotic disease multifocal stenoses, most notably as follows. 4. Up to moderate stenosis within the cavernous right ICA. 5. Moderate/severe stenosis within a superior division proximal M2 right MCA vessel. 6.  Moderate stenosis within a superior division proximal M2 left MCA vessel. 7. Moderate/severe focal stenosis within the distal P2 segment of the left posterior cerebral artery. 8. 2 mm inferiorly projecting vascular protrusion arising from the cavernous left ICA, which may reflect an aneurysm. Electronically Signed   By: Jackey Loge DO   On: 04/03/2021 16:52   DG Chest Portable 1 View  Result Date: 04/03/2021 CLINICAL DATA:  Syncope EXAM: PORTABLE CHEST 1 VIEW COMPARISON:  None. FINDINGS: Mild peribronchial thickening. Heart is upper limits normal in size. No confluent opacities or effusions. No acute bony abnormality. IMPRESSION: Mild bronchitic changes. Electronically Signed   By: Charlett Nose M.D.   On: 04/03/2021 17:37   CT HEAD CODE STROKE WO CONTRAST  Result Date: 04/03/2021 CLINICAL DATA:  Neuro deficit, acute, stroke suspected. Additional history provided: Patient unable to stand with EMS, witnessed episode of unresponsiveness, patient nonverbal  since that time. EXAM: CT HEAD WITHOUT CONTRAST TECHNIQUE: Contiguous axial images were obtained from the base of the skull through the vertex without intravenous contrast. COMPARISON:  Head CT 10/06/2017. FINDINGS: Brain: Cerebral volume is normal for age. Redemonstrated chronic lacunar infarct within the right basal ganglia/anterior limb of right internal capsule. Background mild patchy and ill-defined hypoattenuation within the cerebral white matter, nonspecific but compatible with chronic small vessel ischemic disease. There is no acute intracranial hemorrhage. No demarcated cortical infarct. No extra-axial fluid collection. No evidence of an intracranial mass. No midline shift. Vascular: No hyperdense vessel.  Atherosclerotic calcifications. Skull: Normal. Negative for fracture or focal lesion. Sinuses/Orbits: Visualized orbits show no acute finding. No significant paranasal sinus disease at the imaged levels. ASPECTS ([place] Stroke Program Early CT  Score) - Ganglionic level infarction (caudate, lentiform nuclei, internal capsule, insula, M1-M3 cortex): 7 - Supraganglionic infarction (M4-M6 cortex): 3 Total score (0-10 with 10 being normal): 10 (when discounting a chronic lacunar infarct within the right basal ganglia/internal capsule). No evidence of acute intracranial abnormality. These results were called by telephone at the time of interpretation on 04/03/2021 at 4:03 pm to provider [facility] , who verbally acknowledged these results. IMPRESSION: No evidence of acute intracranial abnormality. Redemonstrated chronic lacunar infarct within the right basal ganglia/right internal capsule. Background mild chronic small vessel ischemic changes within the cerebral white matter. Electronically Signed   By: Jackey Loge DO   On: 04/03/2021 16:04   CT HEAD CODE STROKE WO CONTRAST`  Result Date: 04/03/2021 CLINICAL DATA:  Code stroke. Neuro deficit, acute, stroke suspected and; unresponsive episode EXAM: CT HEAD WITHOUT CONTRAST TECHNIQUE: Contiguous axial images were obtained from the base of the skull through the vertex without intravenous contrast. COMPARISON:  2019 FINDINGS: Brain: No acute intracranial hemorrhage, mass effect, or edema. Gray-white differentiation is preserved. Patchy low-attenuation in the supratentorial white matter is nonspecific but may reflect mild chronic microvascular ischemic changes. Ventricles and sulci are normal in size and configuration. No extra-axial collection. Vascular: No hyperdense vessel or unexpected calcification. There is intracranial atherosclerotic calcification at the skull base. Skull: Unremarkable. Sinuses/Orbits: No acute finding. Other: Mastoid air cells are clear. ASPECTS ([place] Stroke Program Early CT Score) - Ganglionic level infarction (caudate, lentiform nuclei, internal capsule, insula, M1-M3 cortex): 7 - Supraganglionic infarction (M4-M6 cortex): 3 Total score (0-10 with 10 being normal): 10  IMPRESSION: There is no acute intracranial hemorrhage or evidence of acute infarction. ASPECT score is 10. These results were communicated to Dr. Iver Nestle at 3:54 pm on 04/03/2021 by text page via the [facility] messaging system. Electronically Signed   By: Guadlupe Spanish M.D.   On: 04/03/2021 15:56    EKG: Not yet available for review and EMR.  EDP reported was nonischemic.  Assessment/Plan Principal Problem:   Acute focal neurological deficit Active Problems:   Coronary atherosclerosis   Diabetic polyneuropathy associated with type 2 diabetes mellitus (HCC)   Essential hypertension   Gastro-esophageal reflux disease without esophagitis   History of arterial ischemic stroke   Hyperlipidemia   Recurrent major depressive disorder (HCC)   Controlled type 2 diabetes mellitus without complication (HCC)  Acute focal neurologic deficit ?Syncope > Patient presenting with unresponsive episodes and then being nonverbal.  Initial episode at work with EMS and then episode at the MRI scanner.  Able to say some words and answer yes or no questions but not back to baseline initially in ED.  When I saw her she was largely back to baseline but  had some residual intermittent stutter and some mild right grip weakness and right lower extremity weakness. > History of prior seizure-like event and she received a dose of Keppra and was prescribed Keppra but never started it.  Low suspicion for seizure. > CT head, MRI brain and MRI brain without acute abnormalities but MRA does show moderate stenosis of multiple areas.  Also shows known prior stroke. > No acute electrolyte abnormalities.  Elevated glucose as below.  UDS and UA pending. > Mild leukocytosis however she has a chronic leukocytosis.  Some bronchitic changes on chest x-ray and urinalysis pending but no fever.  Lower suspicion for infectious etiology as well. > As above neurology seen the patient in the ED and will continue to follow > There could be a nonorganic  component to this presentation. - Appreciate neurology recommendations - Monitor on telemetry - Follow-up Keppra level - Follow-up urinalysis and UDS - Seizure precautions, neurochecks - As needed Ativan for tonic-clonic activity lasting greater than 5 minutes - Per neurology, will plan to start Depakote 4 mg twice daily if patient has recurrent true seizures. - Swallow screen (passed)  AKI > Creatinine elevated to 1.83 from baseline of around 1.4 in February. > Has received 2 L in the ED. - Avoid nephrotoxic agents - Trend renal function electrolytes  Leukocytosis > This appears to be chronic for her in the 13-17 range.  Currently at 15.  No fever.  Urinalysis is pending and only mild bronchitic changes on chest x-ray. - Follow-up urinalysis - Trend fever curve and white count  Diabetes > Reports taking 40 units of 70/30 insulin in the morning and 14 units of mealtime insulin. - We will give 20 units of 70/30 insulin in the morning  - NovoLog 8 units with meals - SSI - A1c  Hyperlipidemia History of CVA > Had a history with abnormality of gait and speech per chart however she states that she had returned completely to baseline eventually after her stroke.  And her stuttering and her weakness in her grip and mild weakness in her right leg is not her baseline. - Continue home aspirin  Hypertension > BP stable in the ED. - Continue home amlodipine, metoprolol, when tolerating p.o. - Holding spironolactone and Lasix for now considering AKI as above  Depression > Per most recent PCP visit she is only taking fluoxetine.  Other medications on her chart including duloxetine and escitalopram not ordered. - Continue home fluoxetine  GERD - Continue PPI  DVT prophylaxis: Lovenox  Code Status:   Full  Family Communication:  None on admission  Disposition Plan:   Patient is from:  Home  Anticipated DC to:  Pending clinical course  Anticipated DC date:  1 to 3 days  Anticipated  DC barriers: None  Consults called:  Neurology, Dr. Iver Nestle, consulted by EDP and will be continuing to follow.   Admission status:  Observation, progressive   Severity of Illness: The appropriate patient status for this patient is OBSERVATION. Observation status is judged to be reasonable and necessary in order to provide the required intensity of service to ensure the patient's safety. The patient's presenting symptoms, physical exam findings, and initial radiographic and laboratory data in the context of their medical condition is felt to place them at decreased risk for further clinical deterioration. Furthermore, it is anticipated that the patient will be medically stable for discharge from the hospital within 2 midnights of admission. The following factors support the patient status of observation.   "  The patient's presenting symptoms include altered mental status, weakness. " The physical exam findings include mild weakness in the right lower extremity and right grip.  Mild intermittent stuttering.  Otherwise stable exam. " The initial radiographic and laboratory data are Lab work-up showed CMP with sodium 131 which corrects to 135 considering glucose of 343, chloride 96, BUN 25, creatinine elevated 1.3 from baseline of around 1.4.  CBC showed mild leukocytosis to 15 however this appears to be chronic for her, hemoglobin 11.4, platelets 447.  PT, PTT, INR within normal limits.  Ethanol level negative, beta hydroxybutyric acid normal.  Keppra level pending, troponin pending, urinalysis, UDS, urine pregnancy test pending.  Respiratory panel for flu and COVID-negative.  Imaging work-up showed chest x-ray with some mild bronchitic changes, CT head without acute normality, MR brain without acute abnormality but does show changes consistent with small vessel disease however changes are nonspecific also shows evidence of known prior chronic infarct.  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  Synetta Fail MD [facility]  How to contact the [facility] Attending or Consulting provider 7A - 7P or covering provider during after hours 7P -7A, for this patient?   Check the care team in [facility] and look for a) attending/consulting [facility] provider listed and b) the [facility] team listed Log into [email or web address] and use [place]'s universal password to access. If you do not have the password, please contact the hospital operator. Locate the [facility] provider you are looking for under [facility] and page to a number that you can be directly reached. If you still have difficulty reaching the provider, please page the [facility] (Director on Call) for the Hospitalists listed on amion for assistance.  04/03/2021, 7:44 PM

## 2021-04-03 NOTE — ED Notes (Signed)
At approximately 1556 this RN transported pt to MRI, upon pt being wheeled into MRI room pt became unresponsive, pt was apneic, weak radial pulse upon palpation, at approximately 44 bpm; witnessed and observed by this RN. This RN attempted to wake pt unsuccessfully and then began a sternal knock on this pt. Pt then regained consciousness, took a deep breath, and opened their eyes. Episode lasted approximately 14 seconds in total. Upon return of conscious demeanor pt was able to blink for yes and no, showed she was understanding what this RN was saying, was unable to string sentences together, but was able to say the word "cat" on request by this RN. Pt unable to say any other words. Pt then brought into MRI for scan. MD Bhagat informed of episode when she arrived in MRI.

## 2021-04-03 NOTE — ED Notes (Signed)
Pt to ct 

## 2021-04-03 NOTE — Progress Notes (Signed)
CODE STROKE- PHARMACY COMMUNICATION   Time CODE STROKE called/page received:15:25  Time response to CODE STROKE was made (in person): 15:27  Time Stroke Kit retrieved from [facility] (only if needed):not needed  Name of Provider/Nurse contacted: Dr. Curly Shores  Past Medical History:  Diagnosis Date   Diabetes mellitus without complication ([place])    Stroke [facility])    Prior to Admission medications   Medication Sig Start Date End Date Taking? Authorizing Provider  amLODipine (NORVASC) 10 MG tablet Take 10 mg by mouth daily. 03/24/17   [provider]  DULoxetine (CYMBALTA) 60 MG capsule Take 60 mg by mouth daily. 03/24/17   [provider]  insulin aspart (NOVOLOG FLEXPEN) 100 UNIT/ML FlexPen Inject 15 Units into the skin 3 (three) times daily with meals. 06/20/17   [provider]  Insulin Glargine (LANTUS SOLOSTAR) 100 UNIT/ML Solostar Pen Inject 65 Units into the skin every evening. 06/20/17 06/20/18  [provider]  levETIRAcetam (KEPPRA) 500 MG tablet Take 1 tablet (500 mg total) by mouth 2 (two) times daily. 10/06/17   Lavonia Drafts, MD  lisinopril (PRINIVIL,ZESTRIL) 5 MG tablet Take 5 mg by mouth daily. 03/29/17 03/29/18  [provider]  lovastatin (MEVACOR) 40 MG tablet Take 40 mg by mouth daily.    [provider]  metFORMIN (GLUCOPHAGE) 500 MG tablet Take 500 mg by mouth 2 (two) times daily. 03/24/17   [provider]  metoprolol succinate (TOPROL-XL) 25 MG 24 hr tablet Take 1 tablet by mouth daily. 03/24/17 03/24/18  [provider]  nortriptyline (PAMELOR) 10 MG capsule Take 1 capsule by mouth at bedtime. 03/24/17 03/24/18  [provider]  traZODone (DESYREL) 50 MG tablet Take 50 mg by mouth at bedtime as needed for sleep.    [provider]    Vira Blanco ,PharmD Clinical Pharmacist  04/03/2021  4:47 PM

## 2021-04-03 NOTE — ED Notes (Signed)
Neuro at bedside.

## 2021-04-03 NOTE — ED Notes (Signed)
Called Phil at [facility] for code stroke 1522

## 2021-04-03 NOTE — ED Provider Notes (Signed)
[facility] Emergency Department Provider Note  ____________________________________________   Event Date/Time   First MD Initiated Contact with Patient 04/03/21 1507     (approximate)  I have reviewed the triage vital signs and the nursing notes.   HISTORY  Chief Complaint Loss of Consciousness   HPI Linda Delacruz is a 55 y.o. female with a past medical history of anxiety, depression, DM, HDL, tobacco abuse, CAD and per patient report of CVA who presents via EMS from work initial concern for possible diabetes problem but was witnessed to have a syncopal episode with EMS.  She reportedly is nonverbal with EMS and syncopal episode.  She received 260 cc of normal saline in route.  On arrival patient is able to state her name and that her symptom started at lunch at work but otherwise seem to have extremely difficulty with her words and is able to provide any additional history regarding today's events or other additional past medical history.  She states she is not on a blood thinner when asked directly.  She denies illicit drug use.  She denies any acute pain including her chest head abdomen or back.  No recent falls or injuries.         Past Medical History:  Diagnosis Date   Diabetes mellitus without complication (HCC)    Stroke (HCC)     There are no problems to display for this patient.     Prior to Admission medications   Medication Sig Start Date End Date Taking? Authorizing Provider  amLODipine (NORVASC) 10 MG tablet Take 10 mg by mouth daily. 03/24/17  Yes [provider]  DULoxetine (CYMBALTA) 60 MG capsule Take 60 mg by mouth daily. 03/24/17  Yes [provider]  insulin aspart (NOVOLOG) 100 UNIT/ML FlexPen Inject 15 Units into the skin 3 (three) times daily with meals. 06/20/17  Yes [provider]  Insulin Glargine (LANTUS SOLOSTAR) 100 UNIT/ML Solostar Pen Inject 65 Units into the skin every evening. 06/20/17  06/20/18  [provider]  levETIRAcetam (KEPPRA) 500 MG tablet Take 1 tablet (500 mg total) by mouth 2 (two) times daily. 10/06/17   Jene Every, MD  lisinopril (PRINIVIL,ZESTRIL) 5 MG tablet Take 5 mg by mouth daily. 03/29/17 03/29/18  [provider]  lovastatin (MEVACOR) 40 MG tablet Take 40 mg by mouth daily.    [provider]  metFORMIN (GLUCOPHAGE) 500 MG tablet Take 500 mg by mouth 2 (two) times daily. 03/24/17   [provider]  metoprolol succinate (TOPROL-XL) 25 MG 24 hr tablet Take 1 tablet by mouth daily. 03/24/17 03/24/18  [provider]  nortriptyline (PAMELOR) 10 MG capsule Take 1 capsule by mouth at bedtime. 03/24/17 03/24/18  [provider]  traZODone (DESYREL) 50 MG tablet Take 50 mg by mouth at bedtime as needed for sleep.    [provider]    Allergies Patient has no known allergies.  No family history on file.  Social History    Review of Systems  Review of Systems  Unable to perform ROS: Mental status change  Neurological:  Positive for speech change and loss of consciousness.     ____________________________________________   PHYSICAL EXAM:  VITAL SIGNS: ED Triage Vitals  Enc Vitals Group     BP      Pulse      Resp      Temp      Temp src      SpO2  Weight      Height      Head Circumference      Peak Flow      Pain Score      Pain Loc      Pain Edu?      Excl. in GC?    Vitals:   04/03/21 1730 04/03/21 1800  BP: (!) 154/79 (!) 143/77  Pulse: 67 71  Resp: 19 14  Temp:    SpO2: 97% 100%   Physical Exam Vitals and nursing note reviewed.  Constitutional:      General: She is in acute distress.     Appearance: She is ill-appearing.  HENT:     Head: Normocephalic and atraumatic.     Right Ear: External ear normal.     Left Ear: External ear normal.     Nose: Nose normal.     Mouth/Throat:     Mouth: Mucous membranes are dry.  Cardiovascular:     Pulses: Normal  pulses.  Skin:    Capillary Refill: Capillary refill takes 2 to 3 seconds.  Neurological:     Mental Status: She is alert.    Patient seems to have extraocular movements intact and is able to track this examiner.  PERRLA.  EOMI.  She is able to get this examiner thumbs up with both hands but does not grip this examiner on command.  She is able to move her toes on command but does not lift her heels off the bed on command.  She does not otherwise participate in neurological exam. ____________________________________________   LABS (all labs ordered are listed, but only abnormal results are displayed)  Labs Reviewed  CBC - Abnormal; Notable for the following components:      Result Value   WBC 15.0 (*)    Hemoglobin 11.4 (*)    HCT 34.3 (*)    Platelets 447 (*)    All other components within normal limits  DIFFERENTIAL - Abnormal; Notable for the following components:   Neutro Abs 10.8 (*)    Abs Immature Granulocytes 0.12 (*)    All other components within normal limits  COMPREHENSIVE METABOLIC PANEL - Abnormal; Notable for the following components:   Sodium 131 (*)    Chloride 96 (*)    Glucose, Bld 343 (*)    BUN 25 (*)    Creatinine, Ser 1.83 (*)    GFR, Estimated 32 (*)    All other components within normal limits  CBG MONITORING, ED - Abnormal; Notable for the following components:   Glucose-Capillary 329 (*)    All other components within normal limits  CBG MONITORING, ED - Abnormal; Notable for the following components:   Glucose-Capillary 211 (*)    All other components within normal limits  RESP PANEL BY RT-PCR (FLU A&B, COVID) ARPGX2  ETHANOL  PROTIME-INR  APTT  BETA-HYDROXYBUTYRIC ACID  BLOOD GAS, VENOUS  URINE DRUG SCREEN, QUALITATIVE ([facility] ONLY)  URINALYSIS, ROUTINE W REFLEX MICROSCOPIC  HEMOGLOBIN A1C  LEVETIRACETAM LEVEL  POC URINE PREG, ED  TROPONIN I (HIGH SENSITIVITY)  TROPONIN I (HIGH SENSITIVITY)    ____________________________________________  EKG  ECG shows sinus rhythm with a ventricular to 73, normal axis, unremarkable intervals without clearance of acute ischemia or significant arrhythmia. ____________________________________________  RADIOLOGY  ED MD interpretation: CT head without contrast shows no evidence of acute intracranial abnormality including acute bleeding or subacute stroke.  Redemonstrated chronic lacunar infarct in the right basal ganglia and right internal capsule.  Background mild chronic  small vessel ischemic changes within the cerebral white matter.  MRI brain shows no evidence of acute cranial abnormality or seizure foci.  There is some moderate T2 flair hypertensive signals with the cerebellar white matter and pons nonspecific likely reflecting chronic small vessel ischemia.  Redemonstrated chronic lacunar infarct in right basal ganglia and anterior limb of the right internal capsule.  MRA head shows no clear intracranial large vessel occlusion.  Moderate stenosis within the cavernous right ICA.  Moderate severe stenosis with fasciculation of proximal M2 right MCA.  There is also moderate stenosis within the P2 segment of the left posterior cerebral artery.  Possible aneurysm 2 mm inferior projecting vascular protrusion from the cavernous left ICA.  Chest x-ray shows some mild bronchitic changes without evidence of pneumonia, large effusion, overt edema or other clear acute intrathoracic process.  Official radiology report(s): MR ANGIO HEAD WO CONTRAST  Result Date: 04/03/2021 CLINICAL DATA:  Neuro deficit, acute, stroke suspected. EXAM: MRI HEAD WITHOUT CONTRAST MRA HEAD WITHOUT CONTRAST TECHNIQUE: Multiplanar, multi-echo pulse sequences of the brain and surrounding structures were acquired without intravenous contrast. Angiographic images of the Circle of Willis were acquired using MRA technique without intravenous contrast. COMPARISON:  Noncontrast head CT performed  earlier today 04/03/2021. FINDINGS: MRI HEAD FINDINGS Brain: The following protocol was performed at the ordering provider's request: Axial coronal diffusion-weighted imaging, axial T2/FLAIR sequence, axial SWI sequence, coronal and coronal T2/FLAIR sequences oriented perpendicular to the long axis of the hippocampi. Mild generalized cerebral and cerebellar atrophy. Redemonstrated chronic lacunar infarct within the right basal ganglia/anterior limb of right internal capsule. Moderate multifocal T2/FLAIR hyperintensity within the cerebral white matter and pons, nonspecific but most often secondary to chronic small vessel ischemia. The hippocampi are symmetric in size and signal. There is no acute infarct. No evidence of an intracranial mass. No chronic intracranial blood products. No extra-axial fluid collection. No midline shift. Vascular: Reported below. Skull and upper cervical spine: No focal suspicious marrow lesion is identified on the acquired sequences. Sinuses/Orbits: Visualized orbits show no acute finding. No significant paranasal sinus disease. MRA HEAD FINDINGS Anterior circulation: Mildly motion degraded examination. The intracranial internal carotid arteries are patent. Atherosclerotic irregularity of both vessels. Up to moderate stenosis within the cavernous right ICA. Apparent symmetric narrowing of the bilateral internal carotid arteries at the level of the skull base is likely artifactual. The M1 middle cerebral arteries are patent. Atherosclerotic irregularity of the M2 and more distal middle cerebral artery vessels bilaterally. Most notably, there is a severe stenosis within a superior division proximal M2 right MCA vessel (series 1034, image 6). Also of note, there is up to moderate stenosis within a proximal M2 left MCA vessel (series 1034, image 14). The anterior cerebral arteries are patent. Mild stenosis within the proximal A1 right anterior cerebral artery. 2 mm inferiorly projecting  vascular protrusion arising from the cavernous left ICA, which may reflect an aneurysm (series 1034, image 18). Posterior circulation: The intracranial vertebral arteries are patent. The basilar artery is patent. The posterior cerebral arteries are patent. Atherosclerotic irregularity of both vessels. Most notably, there is a moderate/severe focal stenosis within the distal P2 left PCA (series 1050, image 3). Hypoplastic right P1 segment with sizable right posterior communicating artery. A left posterior communicating artery is also present. Anatomic variants: As described IMPRESSION: MRI brain: 1. No evidence of acute intracranial abnormality. 2. No specific seizure focus is identified. 3. Moderate T2/FLAIR hyperintense signal changes within the cerebral white matter and pons, nonspecific but most  often secondary to chronic small vessel ischemia. 4. Redemonstrated chronic lacunar infarct within the right basal ganglia/anterior limb of right internal capsule. 5. Mild generalized parenchymal atrophy. MRA head: 1. Mildly motion degraded exam. 2. No intracranial large vessel occlusion is identified. 3. Intracranial atherosclerotic disease multifocal stenoses, most notably as follows. 4. Up to moderate stenosis within the cavernous right ICA. 5. Moderate/severe stenosis within a superior division proximal M2 right MCA vessel. 6. Moderate stenosis within a superior division proximal M2 left MCA vessel. 7. Moderate/severe focal stenosis within the distal P2 segment of the left posterior cerebral artery. 8. 2 mm inferiorly projecting vascular protrusion arising from the cavernous left ICA, which may reflect an aneurysm. Electronically Signed   By: Jackey Loge DO   On: 04/03/2021 16:52   MR BRAIN WO CONTRAST  Result Date: 04/03/2021 CLINICAL DATA:  Neuro deficit, acute, stroke suspected. EXAM: MRI HEAD WITHOUT CONTRAST MRA HEAD WITHOUT CONTRAST TECHNIQUE: Multiplanar, multi-echo pulse sequences of the brain and  surrounding structures were acquired without intravenous contrast. Angiographic images of the Circle of Willis were acquired using MRA technique without intravenous contrast. COMPARISON:  Noncontrast head CT performed earlier today 04/03/2021. FINDINGS: MRI HEAD FINDINGS Brain: The following protocol was performed at the ordering provider's request: Axial coronal diffusion-weighted imaging, axial T2/FLAIR sequence, axial SWI sequence, coronal and coronal T2/FLAIR sequences oriented perpendicular to the long axis of the hippocampi. Mild generalized cerebral and cerebellar atrophy. Redemonstrated chronic lacunar infarct within the right basal ganglia/anterior limb of right internal capsule. Moderate multifocal T2/FLAIR hyperintensity within the cerebral white matter and pons, nonspecific but most often secondary to chronic small vessel ischemia. The hippocampi are symmetric in size and signal. There is no acute infarct. No evidence of an intracranial mass. No chronic intracranial blood products. No extra-axial fluid collection. No midline shift. Vascular: Reported below. Skull and upper cervical spine: No focal suspicious marrow lesion is identified on the acquired sequences. Sinuses/Orbits: Visualized orbits show no acute finding. No significant paranasal sinus disease. MRA HEAD FINDINGS Anterior circulation: Mildly motion degraded examination. The intracranial internal carotid arteries are patent. Atherosclerotic irregularity of both vessels. Up to moderate stenosis within the cavernous right ICA. Apparent symmetric narrowing of the bilateral internal carotid arteries at the level of the skull base is likely artifactual. The M1 middle cerebral arteries are patent. Atherosclerotic irregularity of the M2 and more distal middle cerebral artery vessels bilaterally. Most notably, there is a severe stenosis within a superior division proximal M2 right MCA vessel (series 1034, image 6). Also of note, there is up to  moderate stenosis within a proximal M2 left MCA vessel (series 1034, image 14). The anterior cerebral arteries are patent. Mild stenosis within the proximal A1 right anterior cerebral artery. 2 mm inferiorly projecting vascular protrusion arising from the cavernous left ICA, which may reflect an aneurysm (series 1034, image 18). Posterior circulation: The intracranial vertebral arteries are patent. The basilar artery is patent. The posterior cerebral arteries are patent. Atherosclerotic irregularity of both vessels. Most notably, there is a moderate/severe focal stenosis within the distal P2 left PCA (series 1050, image 3). Hypoplastic right P1 segment with sizable right posterior communicating artery. A left posterior communicating artery is also present. Anatomic variants: As described IMPRESSION: MRI brain: 1. No evidence of acute intracranial abnormality. 2. No specific seizure focus is identified. 3. Moderate T2/FLAIR hyperintense signal changes within the cerebral white matter and pons, nonspecific but most often secondary to chronic small vessel ischemia. 4. Redemonstrated chronic lacunar infarct within  the right basal ganglia/anterior limb of right internal capsule. 5. Mild generalized parenchymal atrophy. MRA head: 1. Mildly motion degraded exam. 2. No intracranial large vessel occlusion is identified. 3. Intracranial atherosclerotic disease multifocal stenoses, most notably as follows. 4. Up to moderate stenosis within the cavernous right ICA. 5. Moderate/severe stenosis within a superior division proximal M2 right MCA vessel. 6. Moderate stenosis within a superior division proximal M2 left MCA vessel. 7. Moderate/severe focal stenosis within the distal P2 segment of the left posterior cerebral artery. 8. 2 mm inferiorly projecting vascular protrusion arising from the cavernous left ICA, which may reflect an aneurysm. Electronically Signed   By: Jackey Loge DO   On: 04/03/2021 16:52   DG Chest Portable  1 View  Result Date: 04/03/2021 CLINICAL DATA:  Syncope EXAM: PORTABLE CHEST 1 VIEW COMPARISON:  None. FINDINGS: Mild peribronchial thickening. Heart is upper limits normal in size. No confluent opacities or effusions. No acute bony abnormality. IMPRESSION: Mild bronchitic changes. Electronically Signed   By: Charlett Nose M.D.   On: 04/03/2021 17:37   CT HEAD CODE STROKE WO CONTRAST  Result Date: 04/03/2021 CLINICAL DATA:  Neuro deficit, acute, stroke suspected. Additional history provided: Patient unable to stand with EMS, witnessed episode of unresponsiveness, patient nonverbal since that time. EXAM: CT HEAD WITHOUT CONTRAST TECHNIQUE: Contiguous axial images were obtained from the base of the skull through the vertex without intravenous contrast. COMPARISON:  Head CT 10/06/2017. FINDINGS: Brain: Cerebral volume is normal for age. Redemonstrated chronic lacunar infarct within the right basal ganglia/anterior limb of right internal capsule. Background mild patchy and ill-defined hypoattenuation within the cerebral white matter, nonspecific but compatible with chronic small vessel ischemic disease. There is no acute intracranial hemorrhage. No demarcated cortical infarct. No extra-axial fluid collection. No evidence of an intracranial mass. No midline shift. Vascular: No hyperdense vessel.  Atherosclerotic calcifications. Skull: Normal. Negative for fracture or focal lesion. Sinuses/Orbits: Visualized orbits show no acute finding. No significant paranasal sinus disease at the imaged levels. ASPECTS ([place] Stroke Program Early CT Score) - Ganglionic level infarction (caudate, lentiform nuclei, internal capsule, insula, M1-M3 cortex): 7 - Supraganglionic infarction (M4-M6 cortex): 3 Total score (0-10 with 10 being normal): 10 (when discounting a chronic lacunar infarct within the right basal ganglia/internal capsule). No evidence of acute intracranial abnormality. These results were called by telephone at the  time of interpretation on 04/03/2021 at 4:03 pm to provider [facility] , who verbally acknowledged these results. IMPRESSION: No evidence of acute intracranial abnormality. Redemonstrated chronic lacunar infarct within the right basal ganglia/right internal capsule. Background mild chronic small vessel ischemic changes within the cerebral white matter. Electronically Signed   By: Jackey Loge DO   On: 04/03/2021 16:04   CT HEAD CODE STROKE WO CONTRAST`  Result Date: 04/03/2021 CLINICAL DATA:  Code stroke. Neuro deficit, acute, stroke suspected and; unresponsive episode EXAM: CT HEAD WITHOUT CONTRAST TECHNIQUE: Contiguous axial images were obtained from the base of the skull through the vertex without intravenous contrast. COMPARISON:  2019 FINDINGS: Brain: No acute intracranial hemorrhage, mass effect, or edema. Gray-white differentiation is preserved. Patchy low-attenuation in the supratentorial white matter is nonspecific but may reflect mild chronic microvascular ischemic changes. Ventricles and sulci are normal in size and configuration. No extra-axial collection. Vascular: No hyperdense vessel or unexpected calcification. There is intracranial atherosclerotic calcification at the skull base. Skull: Unremarkable. Sinuses/Orbits: No acute finding. Other: Mastoid air cells are clear. ASPECTS [facility] Stroke Program Early CT Score) - Ganglionic level infarction (  caudate, lentiform nuclei, internal capsule, insula, M1-M3 cortex): 7 - Supraganglionic infarction (M4-M6 cortex): 3 Total score (0-10 with 10 being normal): 10 IMPRESSION: There is no acute intracranial hemorrhage or evidence of acute infarction. ASPECT score is 10. These results were communicated to Dr. Iver NestleBhagat at 3:54 pm on 04/03/2021 by text page via the [facility] messaging system. Electronically Signed   By: Guadlupe SpanishPraneil  Patel M.D.   On: 04/03/2021 15:56    ____________________________________________   PROCEDURES  Procedure(s) performed (including  Critical Care):  .1-3 Lead EKG Interpretation  Date/Time: 04/03/2021 6:44 PM Performed by: Gilles ChiquitoSmith, Yariah Selvey P, MD Authorized by: Gilles ChiquitoSmith, Berenise Hunton P, MD     Interpretation: normal     ECG rate assessment: normal     Rhythm: sinus rhythm     Ectopy: none     Conduction: normal     ____________________________________________   INITIAL IMPRESSION / ASSESSMENT AND PLAN / ED COURSE     Patient presents with above to history exam for acute altered mental status and reported syncopal episode with very little details and patient unable provide significant history on arrival.  On arrival she is afebrile and hemodynamically stable.  She is unable to speak normally and only able to tolerate mumble couple words.  She does not fully participate in neuro exam but seems globally weak without obvious hemiparesis or facial droop.  Given sudden onset with possible aphasia within the last 6 hours i.e. around lunchtime although patient is able to provide a precise timing concern for possible CVA and patient made code stroke.  Additional differential includes metabolic derangements, encephalopathy from acute infectious process, possible seizure  CT head without contrast shows no evidence of acute intracranial abnormality including acute bleeding or subacute stroke.  Redemonstrated chronic lacunar infarct in the right basal ganglia and right internal capsule.  Background mild chronic small vessel ischemic changes within the cerebral white matter.  MRI brain shows no evidence of acute cranial abnormality or seizure foci.  There is some moderate T2 flair hypertensive signals with the cerebellar white matter and pons nonspecific likely reflecting chronic small vessel ischemia.  Redemonstrated chronic lacunar infarct in right basal ganglia and anterior limb of the right internal capsule.  MRA head shows no clear intracranial large vessel occlusion.  Moderate stenosis within the cavernous right ICA.  Moderate severe  stenosis with fasciculation of proximal M2 right MCA.  There is also moderate stenosis within the P2 segment of the left posterior cerebral artery.  Possible aneurysm 2 mm inferior projecting vascular protrusion from the cavernous left ICA.  Chest x-ray is unremarkable for evidence of pneumonia, heart failure with possible has some bronchitic changes.  CMP remarkable for hyperglycemia with a glucose of 334 and a creatinine of 1.83 compared to 1.053 years ago 5 other significant Or metabolic derangements.  VBG and nonelevated beta hydroxybutyrate as well as normal anion gap and bicarb 27 are not consistent with DKA.  Serum ethanol is undetectable.  After some discussion with neurology with concern for possible seizure.  Is unclear at this time as patient is currently taking Keppra as is her medication list and Keppra level sent.  ECG shows no evidence of arrhythmia or ischemia although we will send a troponin as well however given otherwise reassuring EKG and initial presentation of a lower suspicion for cardiac syncope.  Of note while in the MRI scanner patient reportedly became apneic for a few seconds but started breathing again spontaneously with a sternal rub from RN.  We will plan  to obtain a UA and UDS as well.  However given absence of fever or leukocytosis Evalose patient for acute infectious etiology at this time.  Overall unclear urology for patient's altered mental status at this time although given she is not back to baseline I will plan to admit to medicine service for further evaluation and management.      ____________________________________________   FINAL CLINICAL IMPRESSION(S) / ED DIAGNOSES  Final diagnoses:  Hyperglycemia  Altered mental status, unspecified altered mental status type  AKI (acute kidney injury) (HCC)    Medications  iohexol (OMNIPAQUE) 350 MG/ML injection 75 mL (has no administration in time range)  insulin aspart (novoLOG) injection 0-15 Units (11  Units Subcutaneous Given 04/03/21 1707)  lactated ringers bolus 1,000 mL (1,000 mLs Intravenous New Bag/Given 04/03/21 1707)  lactated ringers bolus 1,000 mL (1,000 mLs Intravenous New Bag/Given 04/03/21 1821)     ED Discharge Orders     None        Note:  This document was prepared using Dragon voice recognition software and may include unintentional dictation errors.    Gilles Chiquito, MD 04/03/21 [id number]

## 2021-04-03 NOTE — ED Notes (Signed)
Pharmacy at bedside

## 2021-04-03 NOTE — ED Triage Notes (Signed)
Pt to ED via [facility] from work for diabetic problem. Pt has hx/o DM  CBG 393, Pt unable to stand with EMS. Pt had witnessed episode of unresponsiveness with ems and has been non verbal since. Pt has  20 G in the Rt hand and got approximately 250 cc of NS in route.

## 2021-04-03 NOTE — Consult Note (Signed)
Neurology Consultation Reason for Consult: AMS, c/f aphasia code stroke  Requesting Physician: Hulan Saas  CC: Patient is minimally verbal  History is obtained from: Primarily from chart review  HPI: Linda Delacruz is a 55 y.o. female with past medical history significant for type 2 diabetes, hypertension, hyperlipidemia, smoking, coronary artery disease, obesity, seizure-like activity, anxiety/depression.  Per nurse at bedside, patient was at work and EMS was activated.  She was initially verbal on their arrival then had an unresponsive episode after which she has been nonverbal and therefore code stroke was activated for aphasia.  Notably, while being transferred from the ED telemetry monitor to the MRI telemetry monitor the patient had another episode of unresponsiveness lasting about 12 seconds where she stopped breathing and her pulse was noted to be weak and slow on palpation (nursing estimates in the 40s).  By the time she was placed back on the monitor her vital signs recovered to normal.  Her examination remained similar after the scan as it was prior to scan  Per family medicine note from 10/07/2017 ([facility], accessed through care everywhere): "Seizure: Patient was taken to [facility] ED yesterday for evaluation of seizure. Patient states that she was at work as usual, but was feeling generally weak and not like herself. The last thing that she remembers was going to the nurse's office and started to feel tingling in her left arm and top of her head. She was told that after that, her coworkers witnessed her have a generalized tonic-clonic seizure, and afterward she was confused and slightly altered. During transport to the ED, she was witnessed by EMS to have another generalized seizure that lasted 10-15 seconds. In the ED she presented with hypertension and mild confusion. 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 She was given one IV dose of Keppra and plans were made to admit her to the hospital for further evaluation, but within a couple of hours she stated that she felt much better and she left AMA. She states that she just did not feel comfortable staying at that hospital and prefers to see [place] doctors. She was given a prescription for Keppra but she has not filled it yet. Since going home yesterday, she has not had any recurrence of seizure activity, and denies any vision changes, weakness, paresthesias, dizziness, or gait changes. She does endorse having recurring headaches over the past couple of weeks."  I am unable to find any neurology notes within the systems I can access and it is unclear if the patient is continuing to take [place] and who might be prescribing it.  LKW: Approximately 2 PM although unclear tPA given?: No, exam and imaging not consistent with stroke Premorbid modified rankin scale: 0-1 based on history I am able to obtain     0 - No symptoms.     1 - No significant disability. Able to carry out all usual activities, despite some symptoms.  ROS: Unable to obtain due to altered mental status.   Past Medical History:  Diagnosis Date   Diabetes mellitus without complication ([place])    Stroke [facility])    History reviewed. No pertinent surgical history.   No family history on file. Unable to assess secondary to patient's mental status   Social History:  has no history on file for tobacco use, alcohol use, and drug use.  Current Outpatient Medications  Medication Instructions  amLODipine (NORVASC) 10 mg, Oral, Daily   DULoxetine (CYMBALTA) 60 mg, Oral, Daily   insulin aspart (NOVOLOG FLEXPEN) 15 Units, Subcutaneous, 3 times daily with meals   insulin glargine (LANTUS SOLOSTAR) 65 Units, Subcutaneous, Every evening   levETIRAcetam (KEPPRA) 500 mg, Oral, 2 times daily   lisinopril (ZESTRIL) 5 mg, Oral, Daily   lovastatin (MEVACOR) 40 mg, Oral, Daily   metFORMIN  (GLUCOPHAGE) 500 mg, Oral, 2 times daily   metoprolol succinate (TOPROL-XL) 25 MG 24 hr tablet 1 tablet, Oral, Daily   nortriptyline (PAMELOR) 10 MG capsule 1 capsule, Oral, Daily at bedtime   traZODone (DESYREL) 50 mg, Oral, At bedtime PRN   Outpatient Encounter Medications as of 01/16/2021 per office note from the [facility] Medication Sig Dispense Refill   amLODIPine (NORVASC) 10 MG tablet Take 1 tablet (10 mg total) by mouth once daily 30 tablet 11   blood glucose diagnostic test strip 1 each (1 strip total) 2 (two) times daily Use as instructed. 180 each 3   blood glucose meter kit as directed 1 each 0   blood-glucose meter (ACCU-CHEK AVIVA PLUS METER) Misc 1 each by XX route as directed Dx E11.40 1 each 0   colestipoL (COLESTID) 1 gram tablet Take 2 tablets (2 g total) by mouth once daily 60 tablet 11   diclofenac (VOLTAREN) 1 % topical gel Apply 2 g topically 4 (four) times daily 100 g 0   escitalopram oxalate (LEXAPRO) 10 MG tablet Take 1 tablet (10 mg total) by mouth once daily for 90 days 30 tablet 1   FREESTYLE LIBRE 14 DAY reader Use 1 Device as directed 1 each 1   FREESTYLE LIBRE 14 DAY SENSOR kit Use 1 kit every 14 (fourteen) days for glucose monitoring 1 kit 3   FUROsemide (LASIX) 20 MG tablet Take 1 tablet (20 mg total) by mouth once daily 30 tablet 1   gabapentin (NEURONTIN) 300 MG capsule Take 1 capsule (300 mg total) by mouth 2 (two) times daily AND 2 capsules (600 mg total) nightly. 360 capsule 3   insulin ASPART (NOVOLOG FLEXPEN) pen injector (concentration 100 units/mL) Inject 14 Units subcutaneously 3 (three) times daily with meals 38 mL 3   lisinopriL (ZESTRIL) 10 MG tablet Take 1 tablet (10 mg total) by mouth once daily 30 tablet 11   [EXPIRED] LORazepam (ATIVAN) 0.5 MG tablet Take 1 tablet (0.5 mg total) by mouth 3 (three) times daily as needed for Anxiety for up to 10 days 30 tablet 0   medroxyPROGESTERone (DEPO-PROVERA) 150 mg/mL injection Inject 1 mL (150 mg  total) into the muscle every 3 (three) months 1 mL 3   metoprolol tartrate (LOPRESSOR) 25 MG tablet Take 1 tablet (25 mg total) by mouth 2 (two) times daily 60 tablet 11   omeprazole (PRILOSEC) 20 MG DR capsule Take 20 mg by mouth once daily   pen needle, diabetic 31 gauge x 1/4" needle Use 1 Pentip 4 (four) times daily 100 each 0   spironolactone (ALDACTONE) 50 MG tablet Take 1 tablet (50 mg total) by mouth once daily 90 tablet 3   [DISCONTINUED] insulin GLARGINE (LANTUS SOLOSTAR) pen injector (concentration 100 units/mL) Inject 40 Units subcutaneously nightly 12 mL 11   aspirin 81 MG EC tablet Take 1 tablet (81 mg total) by mouth once daily 90 tablet 3   blood glucose diagnostic (ACCU-CHEK AVIVA PLUS TEST STRP) test strip Use 4 (four) times daily for 242 days Use as instructed. 100 strip 11  dulaglutide (TRULICITY) 1.5 JG/8.1 mL subcutaneous injection Inject 0.5 mLs (1.5 mg total) subcutaneously every 7 (seven) days 2 mL 11   insulin GLARGINE (SEMGLEE PEN U-100 INSULIN) pen injector (concentration 100 units/mL) Inject 40 Units subcutaneously nightly 15 mL 1   lancing device with lancets kit Use 1 each 3 (three) times daily Product selection permitted according to insurance preference. E11.9 Type 2 diabetes mellitus 100 each 11   pen needle, diabetic 31 gauge x 3/16" needle Use as directed 100 each 11  Allergies include Lyrica (itching), Lipitor (low muscle pain), metformin (diarrhea), and zinc oxide  Exam: Current vital signs: BP 131/66   Pulse 71   Temp 98.3 F (36.8 C) (Oral)   Resp 16   Wt 83.2 kg   SpO2 96%   BMI 30.52 kg/m  Vital signs in last 24 hours: Temp:  [98.3 F (36.8 C)] 98.3 F (36.8 C) (07/29 1524) Pulse Rate:  [71-76] 71 (07/29 1614) Resp:  [16-22] 16 (07/29 1614) BP: (106-131)/(66-70) 131/66 (07/29 1614) SpO2:  [96 %-98 %] 96 % (07/29 1614) Weight:  [83.2 kg] 83.2 kg (07/29 1521)   Physical Exam  Constitutional: Appears well-developed and well-nourished.   Psych: Affect flat Eyes: No scleral injection HENT: No oropharyngeal obstruction.  MSK: no joint deformities.  Cardiovascular: Normal rate and regular rhythm.  Respiratory: Effort normal, non-labored breathing GI: Soft.  No distension. There is no tenderness.  Skin: Warm dry and intact visible skin  Neuro: Mental Status: Patient is awake, alert, frequently opens and closes her mouth without making words, sometimes mouths words, able to started her name and some simple objects at times Patient is unable to give history No signs of neglect.  Patient does intermittently follow some simple commands such as opening her mouth and stick out her tongue Cranial Nerves: II: Visual Fields are full to orienting to stimuli in all visual fields. Pupils are equal, round, and reactive to light.   III,IV, VI: EOMI without ptosis or diploplia.  Saccadic pursuits V: Facial sensation is symmetric to light eyelash brush VII: Facial movement is symmetric.  VIII: hearing is intact to voice Motor: Tone is normal. Bulk is normal.  Poor effort, allows her arms and legs to fall, but they do not hit her face or other limbs Sensory: She does move slightly and symmetrically to light noxious stimulation in arms and legs Plantar reflexes: Toes are downgoing bilaterally.  Cerebellar: Unable to assess secondary to patient's mental status   NIHSS total 20 Score breakdown: One-point for drowsiness, 2 points for not answering questions, and one-point for following only some commands, 3 points for each limb weakness (x4 limbs), 2 points for severe aphasia, 2 points for dysarthria  I have reviewed labs in epic and the results pertinent to this consultation are:  Basic Metabolic Panel: Recent Labs  Lab 04/03/21 1527  NA 131*  K 4.5  CL 96*  CO2 27  GLUCOSE 343*  BUN 25*  CREATININE 1.83*  CALCIUM 9.6   Last baseline in our system 1.05, 1.4 in the [facility] system on 10/13/2020  CBC: Recent Labs  Lab  04/03/21 1527  WBC 15.0*  NEUTROABS 10.8*  HGB 11.4*  HCT 34.3*  MCV 84.9  PLT 447*    Coagulation Studies: Recent Labs    04/03/21 1527  LABPROT 12.0  INR 0.9    UA with glucosuria, UDS positive for tricyclics, which patient is prescribed    I have personally reviewed the images obtained:  Head CT without  acute intracranial process MRI brain without acute intracranial process or clear seizure focus, but there are some chronic microvascular ischemic changes as well as a chronic lacunar infarct in the right basal ganglia/anterior limb of the right internal capsule MRA brain without large vessel occlusion, though the patient does have multiple stenoses as detailed in the full radiology report: 1. Mildly motion degraded exam. 2. No intracranial large vessel occlusion is identified. 3. Intracranial atherosclerotic disease multifocal stenoses, most notably as follows. 4. Up to moderate stenosis within the cavernous right ICA. 5. Moderate/severe stenosis within a superior division proximal M2 right MCA vessel. 6. Moderate stenosis within a superior division proximal M2 left MCA vessel. 7. Moderate/severe focal stenosis within the distal P2 segment of the left posterior cerebral artery. 8. 2 mm inferiorly projecting vascular protrusion arising from the cavernous left ICA, which may reflect an aneurysm.  Assessment: Reassuringly, this patient's MRI brain and vessel imaging is negative for acute intracranial process although she does have significant risk factors for stroke as detailed above as well as imaging concerning for significant burden of intracranial atherosclerosis.  Given that she had a very similar event in 2019, after which she was at one point on Keppra but this appears to have been discontinued based on records available to me. I do anticipate that she will recover from this episode as well, however unclear etiology of intermittent unresponsiveness at this time, and  given nursing concern for bradycardia would maintain on monitor at the minimum.  Given my overall low index of suspicion for seizure, I will not start antiseizure medications at this time though if she has recurrent generalized shaking activity highly concerning for seizure activity overnight would use Depakote due to her psychiatric history.  A toxic metabolic process given her AKI is a possibility.  Impression: -AKI -Chronic leukocytosis (white blood cell range 13-17 in the past)  Recommendations: -MRI brain completed during the code stroke process on my recommendation to exclude acute stroke -MRA brain completed on my recommendations as above -Keppra level, though low concern that this event was a seizure per the description that is available -Would not resume Keppra at this time given her significant anxiety/depression history per chart review -Hold on antiseizure medication for now, if high concern for recurrent seizure events suggest Depakote DR, 400 mg twice daily (~10 mg/kg per day divided twice daily) -Seizure precautions, 2 mg Ativan every 5 minutes up to 4 doses for generalized tonic-clonic shaking activity lasting longer than 5 minutes if needed -Observation for return to neurologic baseline -UA, UDS -Telemetry, additional cardiac work-up and monitoring per ED/primary team -Neurology will continue to follow  [place] [phone]

## 2021-04-04 ENCOUNTER — Encounter: Payer: Self-pay | Admitting: Internal Medicine

## 2021-04-04 ENCOUNTER — Other Ambulatory Visit: Payer: Self-pay

## 2021-04-04 DIAGNOSIS — R4182 Altered mental status, unspecified: Secondary | ICD-10-CM | POA: Diagnosis not present

## 2021-04-04 DIAGNOSIS — I10 Essential (primary) hypertension: Secondary | ICD-10-CM | POA: Diagnosis not present

## 2021-04-04 LAB — URINE DRUG SCREEN, QUALITATIVE (ARMC ONLY)
Amphetamines, Ur Screen: NOT DETECTED
Barbiturates, Ur Screen: NOT DETECTED
Benzodiazepine, Ur Scrn: NOT DETECTED
Cannabinoid 50 Ng, Ur ~~LOC~~: NOT DETECTED
Cocaine Metabolite,Ur ~~LOC~~: NOT DETECTED
MDMA (Ecstasy)Ur Screen: NOT DETECTED
Methadone Scn, Ur: NOT DETECTED
Opiate, Ur Screen: NOT DETECTED
Phencyclidine (PCP) Ur S: NOT DETECTED
Tricyclic, Ur Screen: NOT DETECTED

## 2021-04-04 LAB — CBC
HCT: 32.9 % — ABNORMAL LOW (ref 36.0–46.0)
Hemoglobin: 10.7 g/dL — ABNORMAL LOW (ref 12.0–15.0)
MCH: 27.6 pg (ref 26.0–34.0)
MCHC: 32.5 g/dL (ref 30.0–36.0)
MCV: 84.8 fL (ref 80.0–100.0)
Platelets: 404 10*3/uL — ABNORMAL HIGH (ref 150–400)
RBC: 3.88 MIL/uL (ref 3.87–5.11)
RDW: 12.7 % (ref 11.5–15.5)
WBC: 12.8 10*3/uL — ABNORMAL HIGH (ref 4.0–10.5)
nRBC: 0 % (ref 0.0–0.2)

## 2021-04-04 LAB — URINALYSIS, ROUTINE W REFLEX MICROSCOPIC
Bilirubin Urine: NEGATIVE
Glucose, UA: >=500 mg/dL — AB
Hgb urine dipstick: NEGATIVE
Ketones, ur: NEGATIVE mg/dL
Leukocytes,Ua: NEGATIVE
Nitrite: NEGATIVE
Protein, ur: NEGATIVE mg/dL
Specific Gravity, Urine: 1.014 (ref 1.005–1.030)
pH: 6 (ref 5.0–8.0)

## 2021-04-04 LAB — COMPREHENSIVE METABOLIC PANEL
ALT: 13 U/L (ref 0–44)
AST: 19 U/L (ref 15–41)
Albumin: 3.2 g/dL — ABNORMAL LOW (ref 3.5–5.0)
Alkaline Phosphatase: 88 U/L (ref 38–126)
Anion gap: 8 (ref 5–15)
BUN: 20 mg/dL (ref 6–20)
CO2: 25 mmol/L (ref 22–32)
Calcium: 9.2 mg/dL (ref 8.9–10.3)
Chloride: 99 mmol/L (ref 98–111)
Creatinine, Ser: 1.12 mg/dL — ABNORMAL HIGH (ref 0.44–1.00)
GFR, Estimated: 58 mL/min — ABNORMAL LOW (ref >60)
Glucose, Bld: 407 mg/dL — ABNORMAL HIGH (ref 70–99)
Potassium: 4.3 mmol/L (ref 3.5–5.1)
Sodium: 132 mmol/L — ABNORMAL LOW (ref 135–145)
Total Bilirubin: 0.3 mg/dL (ref 0.3–1.2)
Total Protein: 6.4 g/dL — ABNORMAL LOW (ref 6.5–8.1)

## 2021-04-04 LAB — GLUCOSE, CAPILLARY
Glucose-Capillary: 488 mg/dL — ABNORMAL HIGH (ref 70–99)
Glucose-Capillary: 230 mg/dL — ABNORMAL HIGH (ref 70–99)
Glucose-Capillary: 127 mg/dL — ABNORMAL HIGH (ref 70–99)
Glucose-Capillary: 259 mg/dL — ABNORMAL HIGH (ref 70–99)
Glucose-Capillary: 349 mg/dL — ABNORMAL HIGH (ref 70–99)

## 2021-04-04 LAB — HIV ANTIBODY (ROUTINE TESTING W REFLEX): HIV Screen 4th Generation wRfx: NONREACTIVE

## 2021-04-04 MED ORDER — INSULIN ASPART 100 UNIT/ML IJ SOLN
12.0000 [IU] | Freq: Three times a day (TID) | INTRAMUSCULAR | Status: DC
  Administered 2021-04-04: 12 [IU] via SUBCUTANEOUS
  Filled 2021-04-04 (×2): qty 1

## 2021-04-04 MED ORDER — PRAVASTATIN SODIUM 40 MG PO TABS
40.0000 mg | ORAL_TABLET | Freq: Every day | ORAL | Status: DC
  Filled 2021-04-04: qty 1

## 2021-04-04 MED ORDER — DULOXETINE HCL 30 MG PO CPEP
60.0000 mg | ORAL_CAPSULE | Freq: Every day | ORAL | Status: DC
  Administered 2021-04-04 – 2021-04-05 (×2): 60 mg via ORAL
  Filled 2021-04-04 (×2): qty 2

## 2021-04-04 MED ORDER — SPIRONOLACTONE 25 MG PO TABS
50.0000 mg | ORAL_TABLET | Freq: Every day | ORAL | Status: DC
  Administered 2021-04-04 – 2021-04-05 (×2): 50 mg via ORAL
  Filled 2021-04-04 (×2): qty 2

## 2021-04-04 MED ORDER — ESCITALOPRAM OXALATE 10 MG PO TABS
10.0000 mg | ORAL_TABLET | Freq: Every day | ORAL | Status: DC
  Administered 2021-04-04 – 2021-04-05 (×2): 10 mg via ORAL
  Filled 2021-04-04 (×2): qty 1

## 2021-04-04 MED ORDER — HYDROCHLOROTHIAZIDE 25 MG PO TABS: 25.0000 mg | ORAL_TABLET | Freq: Every day | ORAL | Status: DC

## 2021-04-04 MED ORDER — DIPHENHYDRAMINE HCL 25 MG PO CAPS
25.0000 mg | ORAL_CAPSULE | Freq: Once | ORAL | Status: AC
  Administered 2021-04-04: 25 mg via ORAL
  Filled 2021-04-04: qty 1

## 2021-04-04 MED ORDER — DIPHENHYDRAMINE HCL 25 MG PO CAPS
25.0000 mg | ORAL_CAPSULE | Freq: Four times a day (QID) | ORAL | Status: DC | PRN
  Administered 2021-04-04: 25 mg via ORAL
  Filled 2021-04-04: qty 1

## 2021-04-04 MED ORDER — INSULIN ASPART PROT & ASPART (70-30 MIX) 100 UNIT/ML ~~LOC~~ SUSP
20.0000 [IU] | Freq: Two times a day (BID) | SUBCUTANEOUS | Status: DC
  Filled 2021-04-04: qty 10

## 2021-04-04 MED ORDER — DICLOFENAC SODIUM 1 % EX GEL
2.0000 g | Freq: Four times a day (QID) | CUTANEOUS | Status: DC | PRN
  Administered 2021-04-05: 2 g via TOPICAL
  Filled 2021-04-04 (×2): qty 100

## 2021-04-04 NOTE — Progress Notes (Signed)
OT Cancellation Note  Patient Details Name: Linda Delacruz MRN: [id number] DOB: 30-Apr-1966   Cancelled Treatment:    Reason Eval/Treat Not Completed: Patient at procedure or test/ unavailable;Other (comment). Order received and chart reviewed. OT attempted x3 this date, pt noted to have elevated BGL this AM outside therapeutic range, 2nd attempt pt with MD at bedside, on 3rd attempt pt sleeping soundly not waking to voice/light. RN notified of attempts. Will re-attempt next date as able.   Kathie Dike, M.S. OTR/L  04/04/21, 2:57 PM  ascom [phone]

## 2021-04-04 NOTE — Progress Notes (Signed)
[facility]  - [place] [facility]   PATIENT NAME: Linda Delacruz    MR#:  [id number]  DATE OF BIRTH:  Aug 24, 1966  SUBJECTIVE:  overall doing well. His favorite memories from episode yesterday. No seizures reported. Doing well. Denies headache. No family at bedside  REVIEW OF SYSTEMS:   ROS Tolerating Diet: Tolerating PT:   DRUG ALLERGIES:  No Known Allergies  VITALS:  Blood pressure 120/63, pulse 72, temperature 98 F (36.7 C), resp. rate 16, height 5\' 6"  (1.676 m), weight 80.1 kg, SpO2 100 %.  PHYSICAL EXAMINATION:   Physical Exam  GENERAL:  55 y.o.-year-old patient lying in the bed with no acute distress.  LUNGS: Normal breath sounds bilaterally, no wheezing, rales, rhonchi. No use of accessory muscles of respiration.  CARDIOVASCULAR: S1, S2 normal. No murmurs, rubs, or gallops.  ABDOMEN: Soft, nontender, nondistended. Bowel sounds present. No organomegaly or mass.  EXTREMITIES: No cyanosis, clubbing or edema b/l.    NEUROLOGIC: Cranial nerves II through XII are intact. No focal Motor or sensory deficits b/l.   PSYCHIATRIC:  patient is alert and oriented x 3.  SKIN: No obvious rash, lesion, or ulcer.   LABORATORY PANEL:  CBC Recent Labs  Lab 04/04/21 0417  WBC 12.8*  HGB 10.7*  HCT 32.9*  PLT 404*    Chemistries  Recent Labs  Lab 04/03/21 1859 04/04/21 0417  NA  --  132*  K  --  4.3  CL  --  99  CO2  --  25  GLUCOSE  --  407*  BUN  --  20  CREATININE  --  1.12*  CALCIUM  --  9.2  MG 2.2  --   AST  --  19  ALT  --  13  ALKPHOS  --  88  BILITOT  --  0.3   Cardiac Enzymes No results for input(s): TROPONINI in the last 168 hours. RADIOLOGY:  MR ANGIO HEAD WO CONTRAST  Result Date: 04/03/2021 CLINICAL DATA:  Neuro deficit, acute, stroke suspected. EXAM: MRI HEAD WITHOUT CONTRAST MRA HEAD WITHOUT CONTRAST TECHNIQUE: Multiplanar, multi-echo pulse sequences of the brain and surrounding structures were acquired without intravenous  contrast. Angiographic images of the Circle of Willis were acquired using MRA technique without intravenous contrast. COMPARISON:  Noncontrast head CT performed earlier today 04/03/2021. FINDINGS: MRI HEAD FINDINGS Brain: The following protocol was performed at the ordering provider's request: Axial coronal diffusion-weighted imaging, axial T2/FLAIR sequence, axial SWI sequence, coronal and coronal T2/FLAIR sequences oriented perpendicular to the long axis of the hippocampi. Mild generalized cerebral and cerebellar atrophy. Redemonstrated chronic lacunar infarct within the right basal ganglia/anterior limb of right internal capsule. Moderate multifocal T2/FLAIR hyperintensity within the cerebral white matter and pons, nonspecific but most often secondary to chronic small vessel ischemia. The hippocampi are symmetric in size and signal. There is no acute infarct. No evidence of an intracranial mass. No chronic intracranial blood products. No extra-axial fluid collection. No midline shift. Vascular: Reported below. Skull and upper cervical spine: No focal suspicious marrow lesion is identified on the acquired sequences. Sinuses/Orbits: Visualized orbits show no acute finding. No significant paranasal sinus disease. MRA HEAD FINDINGS Anterior circulation: Mildly motion degraded examination. The intracranial internal carotid arteries are patent. Atherosclerotic irregularity of both vessels. Up to moderate stenosis within the cavernous right ICA. Apparent symmetric narrowing of the bilateral internal carotid arteries at the level of the skull base is likely artifactual. The M1 middle cerebral arteries are patent. Atherosclerotic irregularity of  the M2 and more distal middle cerebral artery vessels bilaterally. Most notably, there is a severe stenosis within a superior division proximal M2 right MCA vessel (series 1034, image 6). Also of note, there is up to moderate stenosis within a proximal M2 left MCA vessel (series  1034, image 14). The anterior cerebral arteries are patent. Mild stenosis within the proximal A1 right anterior cerebral artery. 2 mm inferiorly projecting vascular protrusion arising from the cavernous left ICA, which may reflect an aneurysm (series 1034, image 18). Posterior circulation: The intracranial vertebral arteries are patent. The basilar artery is patent. The posterior cerebral arteries are patent. Atherosclerotic irregularity of both vessels. Most notably, there is a moderate/severe focal stenosis within the distal P2 left PCA (series 1050, image 3). Hypoplastic right P1 segment with sizable right posterior communicating artery. A left posterior communicating artery is also present. Anatomic variants: As described IMPRESSION: MRI brain: 1. No evidence of acute intracranial abnormality. 2. No specific seizure focus is identified. 3. Moderate T2/FLAIR hyperintense signal changes within the cerebral white matter and pons, nonspecific but most often secondary to chronic small vessel ischemia. 4. Redemonstrated chronic lacunar infarct within the right basal ganglia/anterior limb of right internal capsule. 5. Mild generalized parenchymal atrophy. MRA head: 1. Mildly motion degraded exam. 2. No intracranial large vessel occlusion is identified. 3. Intracranial atherosclerotic disease multifocal stenoses, most notably as follows. 4. Up to moderate stenosis within the cavernous right ICA. 5. Moderate/severe stenosis within a superior division proximal M2 right MCA vessel. 6. Moderate stenosis within a superior division proximal M2 left MCA vessel. 7. Moderate/severe focal stenosis within the distal P2 segment of the left posterior cerebral artery. 8. 2 mm inferiorly projecting vascular protrusion arising from the cavernous left ICA, which may reflect an aneurysm. Electronically Signed   By: Jackey LogeKyle  Golden DO   On: 04/03/2021 16:52   MR BRAIN WO CONTRAST  Result Date: 04/03/2021 CLINICAL DATA:  Neuro deficit,  acute, stroke suspected. EXAM: MRI HEAD WITHOUT CONTRAST MRA HEAD WITHOUT CONTRAST TECHNIQUE: Multiplanar, multi-echo pulse sequences of the brain and surrounding structures were acquired without intravenous contrast. Angiographic images of the Circle of Willis were acquired using MRA technique without intravenous contrast. COMPARISON:  Noncontrast head CT performed earlier today 04/03/2021. FINDINGS: MRI HEAD FINDINGS Brain: The following protocol was performed at the ordering provider's request: Axial coronal diffusion-weighted imaging, axial T2/FLAIR sequence, axial SWI sequence, coronal and coronal T2/FLAIR sequences oriented perpendicular to the long axis of the hippocampi. Mild generalized cerebral and cerebellar atrophy. Redemonstrated chronic lacunar infarct within the right basal ganglia/anterior limb of right internal capsule. Moderate multifocal T2/FLAIR hyperintensity within the cerebral white matter and pons, nonspecific but most often secondary to chronic small vessel ischemia. The hippocampi are symmetric in size and signal. There is no acute infarct. No evidence of an intracranial mass. No chronic intracranial blood products. No extra-axial fluid collection. No midline shift. Vascular: Reported below. Skull and upper cervical spine: No focal suspicious marrow lesion is identified on the acquired sequences. Sinuses/Orbits: Visualized orbits show no acute finding. No significant paranasal sinus disease. MRA HEAD FINDINGS Anterior circulation: Mildly motion degraded examination. The intracranial internal carotid arteries are patent. Atherosclerotic irregularity of both vessels. Up to moderate stenosis within the cavernous right ICA. Apparent symmetric narrowing of the bilateral internal carotid arteries at the level of the skull base is likely artifactual. The M1 middle cerebral arteries are patent. Atherosclerotic irregularity of the M2 and more distal middle cerebral artery vessels bilaterally. Most  notably,  there is a severe stenosis within a superior division proximal M2 right MCA vessel (series 1034, image 6). Also of note, there is up to moderate stenosis within a proximal M2 left MCA vessel (series 1034, image 14). The anterior cerebral arteries are patent. Mild stenosis within the proximal A1 right anterior cerebral artery. 2 mm inferiorly projecting vascular protrusion arising from the cavernous left ICA, which may reflect an aneurysm (series 1034, image 18). Posterior circulation: The intracranial vertebral arteries are patent. The basilar artery is patent. The posterior cerebral arteries are patent. Atherosclerotic irregularity of both vessels. Most notably, there is a moderate/severe focal stenosis within the distal P2 left PCA (series 1050, image 3). Hypoplastic right P1 segment with sizable right posterior communicating artery. A left posterior communicating artery is also present. Anatomic variants: As described IMPRESSION: MRI brain: 1. No evidence of acute intracranial abnormality. 2. No specific seizure focus is identified. 3. Moderate T2/FLAIR hyperintense signal changes within the cerebral white matter and pons, nonspecific but most often secondary to chronic small vessel ischemia. 4. Redemonstrated chronic lacunar infarct within the right basal ganglia/anterior limb of right internal capsule. 5. Mild generalized parenchymal atrophy. MRA head: 1. Mildly motion degraded exam. 2. No intracranial large vessel occlusion is identified. 3. Intracranial atherosclerotic disease multifocal stenoses, most notably as follows. 4. Up to moderate stenosis within the cavernous right ICA. 5. Moderate/severe stenosis within a superior division proximal M2 right MCA vessel. 6. Moderate stenosis within a superior division proximal M2 left MCA vessel. 7. Moderate/severe focal stenosis within the distal P2 segment of the left posterior cerebral artery. 8. 2 mm inferiorly projecting vascular protrusion arising from  the cavernous left ICA, which may reflect an aneurysm. Electronically Signed   By: Jackey Loge DO   On: 04/03/2021 16:52   DG Chest Portable 1 View  Result Date: 04/03/2021 CLINICAL DATA:  Syncope EXAM: PORTABLE CHEST 1 VIEW COMPARISON:  None. FINDINGS: Mild peribronchial thickening. Heart is upper limits normal in size. No confluent opacities or effusions. No acute bony abnormality. IMPRESSION: Mild bronchitic changes. Electronically Signed   By: Charlett Nose M.D.   On: 04/03/2021 17:37   CT HEAD CODE STROKE WO CONTRAST  Result Date: 04/03/2021 CLINICAL DATA:  Neuro deficit, acute, stroke suspected. Additional history provided: Patient unable to stand with EMS, witnessed episode of unresponsiveness, patient nonverbal since that time. EXAM: CT HEAD WITHOUT CONTRAST TECHNIQUE: Contiguous axial images were obtained from the base of the skull through the vertex without intravenous contrast. COMPARISON:  Head CT 10/06/2017. FINDINGS: Brain: Cerebral volume is normal for age. Redemonstrated chronic lacunar infarct within the right basal ganglia/anterior limb of right internal capsule. Background mild patchy and ill-defined hypoattenuation within the cerebral white matter, nonspecific but compatible with chronic small vessel ischemic disease. There is no acute intracranial hemorrhage. No demarcated cortical infarct. No extra-axial fluid collection. No evidence of an intracranial mass. No midline shift. Vascular: No hyperdense vessel.  Atherosclerotic calcifications. Skull: Normal. Negative for fracture or focal lesion. Sinuses/Orbits: Visualized orbits show no acute finding. No significant paranasal sinus disease at the imaged levels. ASPECTS (Alberta Stroke Program Early CT Score) - Ganglionic level infarction (caudate, lentiform nuclei, internal capsule, insula, M1-M3 cortex): 7 - Supraganglionic infarction (M4-M6 cortex): 3 Total score (0-10 with 10 being normal): 10 (when discounting a chronic lacunar infarct  within the right basal ganglia/internal capsule). No evidence of acute intracranial abnormality. These results were called by telephone at the time of interpretation on 04/03/2021 at 4:03 pm to provider  Antoine Primas , who verbally acknowledged these results. IMPRESSION: No evidence of acute intracranial abnormality. Redemonstrated chronic lacunar infarct within the right basal ganglia/right internal capsule. Background mild chronic small vessel ischemic changes within the cerebral white matter. Electronically Signed   By: Jackey Loge DO   On: 04/03/2021 16:04   CT HEAD CODE STROKE WO CONTRAST`  Result Date: 04/03/2021 CLINICAL DATA:  Code stroke. Neuro deficit, acute, stroke suspected and; unresponsive episode EXAM: CT HEAD WITHOUT CONTRAST TECHNIQUE: Contiguous axial images were obtained from the base of the skull through the vertex without intravenous contrast. COMPARISON:  2019 FINDINGS: Brain: No acute intracranial hemorrhage, mass effect, or edema. Gray-white differentiation is preserved. Patchy low-attenuation in the supratentorial white matter is nonspecific but may reflect mild chronic microvascular ischemic changes. Ventricles and sulci are normal in size and configuration. No extra-axial collection. Vascular: No hyperdense vessel or unexpected calcification. There is intracranial atherosclerotic calcification at the skull base. Skull: Unremarkable. Sinuses/Orbits: No acute finding. Other: Mastoid air cells are clear. ASPECTS (Alberta Stroke Program Early CT Score) - Ganglionic level infarction (caudate, lentiform nuclei, internal capsule, insula, M1-M3 cortex): 7 - Supraganglionic infarction (M4-M6 cortex): 3 Total score (0-10 with 10 being normal): 10 IMPRESSION: There is no acute intracranial hemorrhage or evidence of acute infarction. ASPECT score is 10. These results were communicated to Dr. Iver Nestle at 3:54 pm on 04/03/2021 by text page via the [facility] messaging system. Electronically Signed   By:  Guadlupe Spanish M.D.   On: 04/03/2021 15:56   ASSESSMENT AND PLAN:   Linda Delacruz is a 55 y.o. female with medical history significant of CVA, diabetes, hyperlipidemia, hypertension, GERD, depression, coronary atherosclerosis presents after episode of altered mental status.  Altered mental status with episode of unresponsiveness -- etiology unclear -- MRI ruled out stroke -- MRI brain shows intracranial atherosclerosis -- no seizures noted -- seen by neurology. Recommendations noted. Dictation for antiseizure meds. -- patient in sinus rhythm -- if needed consider starting Depakote -- patient meditating well. Hemodynamically stable  AK I -- received IV fluids -- avoid nephrotoxic agents  type II diabetes, insulin requiring with hyperglycemia -- resume home dose insulin -- sliding scale insulin  Hypertension -- continue amlodipine and metoprolol  history of depression/anxiety -- continue Cymbalta and Lexapro  Gerd continue PPI    Procedures: Family communication :none Consults : neuro- CODE STATUS: full DVT Prophylaxis : enoxaparin Level of care: Progressive Cardiac Status is: Observation  The patient remains OBS appropriate and will d/c before 2 midnights.  Dispo: The patient is from: Home              Anticipated d/c is to: Home              Patient currently is medically stable to d/c.   Difficult to place patient No   patient is hemodynamically stable. She is nearing baseline. She wants to rest one more day. Remains stable will discharge her tomorrow     TOTAL TIME TAKING CARE OF THIS PATIENT: 25 minutes.  >50% time spent on counselling and coordination of care  Note: This dictation was prepared with Dragon dictation along with smaller phrase technology. Any transcriptional errors that result from this process are unintentional.  Enedina Finner M.D    [facility]   CC: Primary care physician; [facility] Patient ID: Linda Delacruz,  female   DOB: 1966-04-16, 55 y.o.   MRN: [id number]

## 2021-04-04 NOTE — Progress Notes (Signed)
PT Cancellation Note  Patient Details Name: Linda Delacruz MRN: [id number] DOB: July 26, 1966   Cancelled Treatment:    Reason Eval/Treat Not Completed: Medical issues which prohibited therapy Blood glucose 480, insulin just given prior to checking in on patient. Will hold for glucose recheck.   Aili Casillas A Trung Wenzl 04/04/2021, 1:38 PM

## 2021-04-04 NOTE — Progress Notes (Signed)
Neurology Progress Note  Patient ID: Linda Delacruz is a 55 y.o. female with past medical history significant for type 2 diabetes, hypertension, hyperlipidemia, smoking, coronary artery disease, obesity, seizure-like activity, anxiety/depression, remote stroke (right basal ganglia/anterior limb of the right internal capsule, without residual deficits), speech impairment per chart notes though patient denies any stuttering at baseline.  Initially consulted for: Altered mental status  Subjective: Feels at her near her baseline.  Is not able to recall full details of the events yesterday though does have some patchy memories.  Can tell me that she was at her job as a Geographical information systems officer for [employer] when she began to feel unwell, thought her blood sugar was low, told her colleagues she was not feeling well at which time EMS was called.  She reports he does not remember the EMS ride and remembers bits and pieces of what happened in the ED, including a lot of tests.  Reports being concerned that her sugar was low last night (104).  Initially refusing long-acting insulin for nursing at bedside, agreeing only to the sliding scale dose  Exam: Vitals:   04/04/21 0411 04/04/21 0722  BP: (!) 111/53 136/63  Pulse: 73 75  Resp:  17  Temp: 98.4 F (36.9 C) 98 F (36.7 C)  SpO2: 100% 100%   Gen: In bed, comfortable  Resp: non-labored breathing, no grossly audible wheezing Cardiac: Perfusing extremities well  Abd: soft, nt  Neuro: MS: Awake, alert, oriented to person and situation, month and location.  Does occasionally have variable speech hesitation/stutter especially when she seems a little more anxious CN: Visual fields full to confrontation, EOMI, PERRLA, face sensation and movement symmetric, tongue midline, giveaway weakness with head turn to either side Motor: Giveaway weakness throughout, distractible tremor that is intermittent and variable amplitude Sensory: Reports some reduced  sensation in the left lower extremity, cannot recall herself if this is her prior presenting stroke symptoms Coordination: Finger-to-nose and heel-to-shin intact  Pertinent Labs:  Basic Metabolic Panel: Recent Labs  Lab 04/03/21 1527 04/03/21 1859 04/04/21 0417  NA 131*  --  132*  K 4.5  --  4.3  CL 96*  --  99  CO2 27  --  25  GLUCOSE 343*  --  407*  BUN 25*  --  20  CREATININE 1.83*  --  1.12*  CALCIUM 9.6  --  9.2  MG  --  2.2  --   Anion gap of 8  CBC: Recent Labs  Lab 04/03/21 1527 04/04/21 0417  WBC 15.0* 12.8*  NEUTROABS 10.8*  --   HGB 11.4* 10.7*  HCT 34.3* 32.9*  MCV 84.9 84.8  PLT 447* 404*    Coagulation Studies: Recent Labs    04/03/21 1527  LABPROT 12.0  INR 0.9     Impression: Etiology of her symptoms yesterday remain unclear.  There continue to be some functional neurological signs on her examination today but overall she is markedly improved.  Additionally work-up today has been reassuring.  If she is still medically in the hospital Monday due to other reasons, could obtain EEG here, but if she is otherwise ready for discharge, would plan for outpatient EEG to complete her neurological work-up, as functional neurological disorders is a diagnosis of exclusion.  Recommendations: -Defer glucose management to primary team -No further inpatient neurological needs -Patient prefers outpatient follow-up with [facility] clinic neurology, should be given number to call for appointment [phone]   [facility] MD-PhD [facility] [phone]  Greater than 35 minutes were spent in direct care of this patient today, of which greater than 50% was at bedside collecting detailed history and performing a more detailed exam given limitations due to her mental status yesterday.

## 2021-04-04 NOTE — Progress Notes (Signed)
Pt is A&O, VS stable, admitted to me this morning. OBS, plan for D/C today, neuro checks ok. NSR on the monitor. IVx2 in place. No complaints of pain or discomfort.

## 2021-04-05 DIAGNOSIS — I10 Essential (primary) hypertension: Secondary | ICD-10-CM | POA: Diagnosis not present

## 2021-04-05 LAB — GLUCOSE, CAPILLARY: Glucose-Capillary: 359 mg/dL — ABNORMAL HIGH (ref 70–99)

## 2021-04-05 MED ORDER — LORAZEPAM 0.5 MG PO TABS: 0.5000 mg | ORAL_TABLET | Freq: Three times a day (TID) | ORAL | Status: DC | PRN

## 2021-04-05 MED ORDER — FUROSEMIDE 20 MG PO TABS
20.0000 mg | ORAL_TABLET | Freq: Every day | ORAL | Status: DC
  Administered 2021-04-05: 20 mg via ORAL
  Filled 2021-04-05: qty 1

## 2021-04-05 MED ORDER — DICLOFENAC SODIUM 1 % EX GEL: 2.0000 g | Freq: Four times a day (QID) | CUTANEOUS | 0 refills | Status: AC | PRN

## 2021-04-05 MED ORDER — INSULIN ASPART 100 UNIT/ML ~~LOC~~ SOLN
15.0000 [IU] | Freq: Three times a day (TID) | SUBCUTANEOUS | Status: DC
  Filled 2021-04-05: qty 1

## 2021-04-05 NOTE — Progress Notes (Signed)
OT Cancellation Note  Patient Details Name: Linda Delacruz MRN: [id number] DOB: 11-Mar-1966   Cancelled Treatment:    Reason Eval/Treat Not Completed: Other (comment). Per MD via secure chat, pt is ambulatory in room and no need for OT evaluation at this time. Will complete OT orders at this time. Please re-consult if new needs arise.  Matthew Folks, OTR/L [facility] [phone]

## 2021-04-05 NOTE — Discharge Summary (Signed)
[facility] [place] at [facility]   PATIENT NAME: Linda Delacruz    MR#:  [id number]  DATE OF BIRTH:  [birth year]  DATE OF ADMISSION:  04/03/2021 ADMITTING PHYSICIAN: Synetta Fail, MD  DATE OF DISCHARGE: 04/05/2021  PRIMARY CARE PHYSICIAN: [place], [facility]    ADMISSION DIAGNOSIS:  Hyperglycemia [R73.9] AKI (acute kidney injury) (HCC) [N17.9] Acute focal neurological deficit [R29.818] Altered mental status, unspecified altered mental status type [R41.82]  DISCHARGE DIAGNOSIS:  altered mental status with episode of unresponsiveness resolved-- unclear etiology AK I improved SECONDARY DIAGNOSIS:   Past Medical History:  Diagnosis Date  . Diabetes mellitus without complication (HCC)   . Stroke [facility])     HOSPITAL COURSE:   Linda Delacruz is a 55 y.o. female with medical history significant of CVA, diabetes, hyperlipidemia, hypertension, GERD, depression, coronary atherosclerosis presents after episode of altered mental status.   Altered mental status with episode of unresponsiveness -- etiology unclear -- MRI ruled out stroke -- MRI brain shows intracranial atherosclerosis -- no seizures noted -- seen by neurology. Recommendations noted. No indication for antiseizure meds. -- patient in sinus rhythm -- if needed consider starting Depakote -- patient mentating well. Hemodynamically stable   AK I -- received IV fluids -- avoid nephrotoxic agents   type II diabetes, insulin requiring with hyperglycemia -- resume home dose insulin -- sliding scale insulin   Hypertension -- continue amlodipine and metoprolol   history of depression/anxiety -- continue Cymbalta and Lexapro   Gerd continue PPI   Pt overall at baseline Ol to go home today. F/u PCP as outpt Ambulating in the room by herself     Procedures: Family communication :none Consults : neuro- Dr Iver Nestle CODE STATUS: full DVT Prophylaxis : enoxaparin Level of care:  Progressive Cardiac Status is: Observation   Dispo: The patient is from: Home              Anticipated d/c is to: Home today              Patient currently is medically stable to d/c.              Difficult to place patient No   CONSULTS OBTAINED:    DRUG ALLERGIES:  No Known Allergies  DISCHARGE MEDICATIONS:   Allergies as of 04/05/2021   No Known Allergies      Medication List     STOP taking these medications    Lantus SoloStar 100 UNIT/ML Solostar Pen Generic drug: insulin glargine   lisinopril 5 MG tablet Commonly known as: ZESTRIL   metoprolol succinate 25 MG 24 hr tablet Commonly known as: TOPROL-XL   nortriptyline 10 MG capsule Commonly known as: PAMELOR       TAKE these medications    amLODipine 10 MG tablet Commonly known as: NORVASC Take 10 mg by mouth daily.   aspirin 81 MG EC tablet Take by mouth.   diclofenac Sodium 1 % Gel Commonly known as: VOLTAREN Apply 2 g topically 4 (four) times daily as needed (foot pain).   DULoxetine 60 MG capsule Commonly known as: CYMBALTA Take 60 mg by mouth daily.   escitalopram 10 MG tablet Commonly known as: LEXAPRO Take 10 mg by mouth daily.   FLUoxetine 40 MG capsule Commonly known as: PROZAC Take 40 mg by mouth daily as needed.   furosemide 20 MG tablet Commonly known as: LASIX Take 20 mg by mouth daily.   gabapentin 300 MG capsule Commonly known as: NEURONTIN Take  by mouth 2 (two) times daily.   HumuLIN 70/30 KwikPen (70-30) 100 UNIT/ML KwikPen Generic drug: insulin isophane & regular human KwikPen Take 20 units at breakfast and 10 units at dinner   hydrochlorothiazide 25 MG tablet Commonly known as: HYDRODIURIL Take by mouth.   insulin aspart 100 UNIT/ML FlexPen Commonly known as: NOVOLOG Inject 15 Units into the skin 3 (three) times daily with meals.   LORazepam 0.5 MG tablet Commonly known as: ATIVAN Take 0.5 mg by mouth 3 (three) times daily as needed.   lovastatin 40 MG  tablet Commonly known as: MEVACOR Take 40 mg by mouth daily.   meloxicam 15 MG tablet Commonly known as: MOBIC Take by mouth.   metoprolol tartrate 25 MG tablet Commonly known as: LOPRESSOR Take 25 mg by mouth 2 (two) times daily.   omeprazole 20 MG capsule Commonly known as: PRILOSEC Take by mouth.   spironolactone 50 MG tablet Commonly known as: ALDACTONE Take 50 mg by mouth daily.        If you experience worsening of your admission symptoms, develop shortness of breath, life threatening emergency, suicidal or homicidal thoughts you must seek medical attention immediately by calling 911 or calling your MD immediately  if symptoms less severe.  You Must read complete instructions/literature along with all the possible adverse reactions/side effects for all the Medicines you take and that have been prescribed to you. Take any new Medicines after you have completely understood and accept all the possible adverse reactions/side effects.   Please note  You were cared for by a hospitalist during your hospital stay. If you have any questions about your discharge medications or the care you received while you were in the hospital after you are discharged, you can call the unit and asked to speak with the hospitalist on call if the hospitalist that took care of you is not available. Once you are discharged, your primary care physician will handle any further medical issues. Please note that NO REFILLS for any discharge medications will be authorized once you are discharged, as it is imperative that you return to your primary care physician (or establish a relationship with a primary care physician if you do not have one) for your aftercare needs so that they can reassess your need for medications and monitor your lab values. Today   SUBJECTIVE   No new complaints  VITAL SIGNS:  Blood pressure (!) 146/53, pulse 70, temperature 98.3 F (36.8 C), resp. rate 14, height 5\' 6"  (1.676 m),  weight 76.2 kg, SpO2 100 %.  I/O:   Intake/Output Summary (Last 24 hours) at 04/05/2021 0733 Last data filed at 04/04/2021 2100 Gross per 24 hour  Intake 1120 ml  Output 250 ml  Net 870 ml    PHYSICAL EXAMINATION:    GENERAL:  55 y.o.-year-old patient lying in the bed with no acute distress. LUNGS: Normal breath sounds bilaterally, no wheezing, rales, rhonchi. No use of accessory muscles of respiration. CARDIOVASCULAR: S1, S2 normal. No murmurs, rubs, or gallops. ABDOMEN: Soft, nontender, nondistended. Bowel sounds present. No organomegaly or mass. EXTREMITIES: No cyanosis, clubbing or edema b/l.    NEUROLOGIC: Cranial nerves II through XII are intact. No focal Motor or sensory deficits b/l.   PSYCHIATRIC:  patient is alert and oriented x 3. SKIN: No obvious rash, lesion, or ulcer.  DATA REVIEW:   CBC  Recent Labs  Lab 04/04/21 0417  WBC 12.8*  HGB 10.7*  HCT 32.9*  PLT 404*    Chemistries  Recent Labs  Lab 04/03/21 1859 04/04/21 0417  NA  --  132*  K  --  4.3  CL  --  99  CO2  --  25  GLUCOSE  --  407*  BUN  --  20  CREATININE  --  1.12*  CALCIUM  --  9.2  MG 2.2  --   AST  --  19  ALT  --  13  ALKPHOS  --  88  BILITOT  --  0.3    Microbiology Results   Recent Results (from the past 240 hour(s))  Resp Panel by RT-PCR (Flu A&B, Covid) Nasopharyngeal Swab     Status: None   Collection Time: 04/03/21  3:27 PM   Specimen: Nasopharyngeal Swab; Nasopharyngeal(NP) swabs in vial transport medium  Result Value Ref Range Status   SARS Coronavirus 2 by RT PCR NEGATIVE NEGATIVE Final    Comment: (NOTE) SARS-CoV-2 target nucleic acids are NOT DETECTED.  The SARS-CoV-2 RNA is generally detectable in upper respiratory specimens during the acute phase of infection. The lowest concentration of SARS-CoV-2 viral copies this assay can detect is 138 copies/mL. A negative result does not preclude SARS-Cov-2 infection and should not be used as the sole basis for treatment  or other patient management decisions. A negative result may occur with  improper specimen collection/handling, submission of specimen other than nasopharyngeal swab, presence of viral mutation(s) within the areas targeted by this assay, and inadequate number of viral copies(<138 copies/mL). A negative result must be combined with clinical observations, patient history, and epidemiological information. The expected result is Negative.  Fact Sheet for Patients:  BloggerCourse.com  Fact Sheet for Healthcare Providers:  SeriousBroker.it  This test is no t yet approved or cleared by the [place] FDA and  has been authorized for detection and/or diagnosis of SARS-CoV-2 by FDA under an Emergency Use Authorization (EUA). This EUA will remain  in effect (meaning this test can be used) for the duration of the COVID-19 declaration under Section 564(b)(1) of the Act, 21 U.S.C.section 360bbb-3(b)(1), unless the authorization is terminated  or revoked sooner.       Influenza A by PCR NEGATIVE NEGATIVE Final   Influenza B by PCR NEGATIVE NEGATIVE Final    Comment: (NOTE) The Xpert Xpress SARS-CoV-2/FLU/RSV plus assay is intended as an aid in the diagnosis of influenza from Nasopharyngeal swab specimens and should not be used as a sole basis for treatment. Nasal washings and aspirates are unacceptable for Xpert Xpress SARS-CoV-2/FLU/RSV testing.  Fact Sheet for Patients: BloggerCourse.com  Fact Sheet for Healthcare Providers: SeriousBroker.it  This test is not yet approved or cleared by the [place] FDA and has been authorized for detection and/or diagnosis of SARS-CoV-2 by FDA under an Emergency Use Authorization (EUA). This EUA will remain in effect (meaning this test can be used) for the duration of the COVID-19 declaration under Section 564(b)(1) of the Act, 21 U.S.C. section  360bbb-3(b)(1), unless the authorization is terminated or revoked.  Performed at [facility], [address] [place], [place] [address]     RADIOLOGY:  MR ANGIO HEAD WO CONTRAST  Result Date: 04/03/2021 CLINICAL DATA:  Neuro deficit, acute, stroke suspected. EXAM: MRI HEAD WITHOUT CONTRAST MRA HEAD WITHOUT CONTRAST TECHNIQUE: Multiplanar, multi-echo pulse sequences of the brain and surrounding structures were acquired without intravenous contrast. Angiographic images of the Circle of Willis were acquired using MRA technique without intravenous contrast. COMPARISON:  Noncontrast head CT performed earlier today 04/03/2021. FINDINGS: MRI HEAD  FINDINGS Brain: The following protocol was performed at the ordering provider's request: Axial coronal diffusion-weighted imaging, axial T2/FLAIR sequence, axial SWI sequence, coronal and coronal T2/FLAIR sequences oriented perpendicular to the long axis of the hippocampi. Mild generalized cerebral and cerebellar atrophy. Redemonstrated chronic lacunar infarct within the right basal ganglia/anterior limb of right internal capsule. Moderate multifocal T2/FLAIR hyperintensity within the cerebral white matter and pons, nonspecific but most often secondary to chronic small vessel ischemia. The hippocampi are symmetric in size and signal. There is no acute infarct. No evidence of an intracranial mass. No chronic intracranial blood products. No extra-axial fluid collection. No midline shift. Vascular: Reported below. Skull and upper cervical spine: No focal suspicious marrow lesion is identified on the acquired sequences. Sinuses/Orbits: Visualized orbits show no acute finding. No significant paranasal sinus disease. MRA HEAD FINDINGS Anterior circulation: Mildly motion degraded examination. The intracranial internal carotid arteries are patent. Atherosclerotic irregularity of both vessels. Up to moderate stenosis within the cavernous right ICA. Apparent symmetric  narrowing of the bilateral internal carotid arteries at the level of the skull base is likely artifactual. The M1 middle cerebral arteries are patent. Atherosclerotic irregularity of the M2 and more distal middle cerebral artery vessels bilaterally. Most notably, there is a severe stenosis within a superior division proximal M2 right MCA vessel (series 1034, image 6). Also of note, there is up to moderate stenosis within a proximal M2 left MCA vessel (series 1034, image 14). The anterior cerebral arteries are patent. Mild stenosis within the proximal A1 right anterior cerebral artery. 2 mm inferiorly projecting vascular protrusion arising from the cavernous left ICA, which may reflect an aneurysm (series 1034, image 18). Posterior circulation: The intracranial vertebral arteries are patent. The basilar artery is patent. The posterior cerebral arteries are patent. Atherosclerotic irregularity of both vessels. Most notably, there is a moderate/severe focal stenosis within the distal P2 left PCA (series 1050, image 3). Hypoplastic right P1 segment with sizable right posterior communicating artery. A left posterior communicating artery is also present. Anatomic variants: As described IMPRESSION: MRI brain: 1. No evidence of acute intracranial abnormality. 2. No specific seizure focus is identified. 3. Moderate T2/FLAIR hyperintense signal changes within the cerebral white matter and pons, nonspecific but most often secondary to chronic small vessel ischemia. 4. Redemonstrated chronic lacunar infarct within the right basal ganglia/anterior limb of right internal capsule. 5. Mild generalized parenchymal atrophy. MRA head: 1. Mildly motion degraded exam. 2. No intracranial large vessel occlusion is identified. 3. Intracranial atherosclerotic disease multifocal stenoses, most notably as follows. 4. Up to moderate stenosis within the cavernous right ICA. 5. Moderate/severe stenosis within a superior division proximal M2  right MCA vessel. 6. Moderate stenosis within a superior division proximal M2 left MCA vessel. 7. Moderate/severe focal stenosis within the distal P2 segment of the left posterior cerebral artery. 8. 2 mm inferiorly projecting vascular protrusion arising from the cavernous left ICA, which may reflect an aneurysm. Electronically Signed   By: Jackey Loge DO   On: 04/03/2021 16:52   MR BRAIN WO CONTRAST  Result Date: 04/03/2021 CLINICAL DATA:  Neuro deficit, acute, stroke suspected. EXAM: MRI HEAD WITHOUT CONTRAST MRA HEAD WITHOUT CONTRAST TECHNIQUE: Multiplanar, multi-echo pulse sequences of the brain and surrounding structures were acquired without intravenous contrast. Angiographic images of the Circle of Willis were acquired using MRA technique without intravenous contrast. COMPARISON:  Noncontrast head CT performed earlier today 04/03/2021. FINDINGS: MRI HEAD FINDINGS Brain: The following protocol was performed at the ordering provider's request: Axial  coronal diffusion-weighted imaging, axial T2/FLAIR sequence, axial SWI sequence, coronal and coronal T2/FLAIR sequences oriented perpendicular to the long axis of the hippocampi. Mild generalized cerebral and cerebellar atrophy. Redemonstrated chronic lacunar infarct within the right basal ganglia/anterior limb of right internal capsule. Moderate multifocal T2/FLAIR hyperintensity within the cerebral white matter and pons, nonspecific but most often secondary to chronic small vessel ischemia. The hippocampi are symmetric in size and signal. There is no acute infarct. No evidence of an intracranial mass. No chronic intracranial blood products. No extra-axial fluid collection. No midline shift. Vascular: Reported below. Skull and upper cervical spine: No focal suspicious marrow lesion is identified on the acquired sequences. Sinuses/Orbits: Visualized orbits show no acute finding. No significant paranasal sinus disease. MRA HEAD FINDINGS Anterior circulation:  Mildly motion degraded examination. The intracranial internal carotid arteries are patent. Atherosclerotic irregularity of both vessels. Up to moderate stenosis within the cavernous right ICA. Apparent symmetric narrowing of the bilateral internal carotid arteries at the level of the skull base is likely artifactual. The M1 middle cerebral arteries are patent. Atherosclerotic irregularity of the M2 and more distal middle cerebral artery vessels bilaterally. Most notably, there is a severe stenosis within a superior division proximal M2 right MCA vessel (series 1034, image 6). Also of note, there is up to moderate stenosis within a proximal M2 left MCA vessel (series 1034, image 14). The anterior cerebral arteries are patent. Mild stenosis within the proximal A1 right anterior cerebral artery. 2 mm inferiorly projecting vascular protrusion arising from the cavernous left ICA, which may reflect an aneurysm (series 1034, image 18). Posterior circulation: The intracranial vertebral arteries are patent. The basilar artery is patent. The posterior cerebral arteries are patent. Atherosclerotic irregularity of both vessels. Most notably, there is a moderate/severe focal stenosis within the distal P2 left PCA (series 1050, image 3). Hypoplastic right P1 segment with sizable right posterior communicating artery. A left posterior communicating artery is also present. Anatomic variants: As described IMPRESSION: MRI brain: 1. No evidence of acute intracranial abnormality. 2. No specific seizure focus is identified. 3. Moderate T2/FLAIR hyperintense signal changes within the cerebral white matter and pons, nonspecific but most often secondary to chronic small vessel ischemia. 4. Redemonstrated chronic lacunar infarct within the right basal ganglia/anterior limb of right internal capsule. 5. Mild generalized parenchymal atrophy. MRA head: 1. Mildly motion degraded exam. 2. No intracranial large vessel occlusion is identified. 3.  Intracranial atherosclerotic disease multifocal stenoses, most notably as follows. 4. Up to moderate stenosis within the cavernous right ICA. 5. Moderate/severe stenosis within a superior division proximal M2 right MCA vessel. 6. Moderate stenosis within a superior division proximal M2 left MCA vessel. 7. Moderate/severe focal stenosis within the distal P2 segment of the left posterior cerebral artery. 8. 2 mm inferiorly projecting vascular protrusion arising from the cavernous left ICA, which may reflect an aneurysm. Electronically Signed   By: Jackey LogeKyle  Golden DO   On: 04/03/2021 16:52   DG Chest Portable 1 View  Result Date: 04/03/2021 CLINICAL DATA:  Syncope EXAM: PORTABLE CHEST 1 VIEW COMPARISON:  None. FINDINGS: Mild peribronchial thickening. Heart is upper limits normal in size. No confluent opacities or effusions. No acute bony abnormality. IMPRESSION: Mild bronchitic changes. Electronically Signed   By: Charlett NoseKevin  Dover M.D.   On: 04/03/2021 17:37   CT HEAD CODE STROKE WO CONTRAST  Result Date: 04/03/2021 CLINICAL DATA:  Neuro deficit, acute, stroke suspected. Additional history provided: Patient unable to stand with EMS, witnessed episode of unresponsiveness, patient nonverbal  since that time. EXAM: CT HEAD WITHOUT CONTRAST TECHNIQUE: Contiguous axial images were obtained from the base of the skull through the vertex without intravenous contrast. COMPARISON:  Head CT 10/06/2017. FINDINGS: Brain: Cerebral volume is normal for age. Redemonstrated chronic lacunar infarct within the right basal ganglia/anterior limb of right internal capsule. Background mild patchy and ill-defined hypoattenuation within the cerebral white matter, nonspecific but compatible with chronic small vessel ischemic disease. There is no acute intracranial hemorrhage. No demarcated cortical infarct. No extra-axial fluid collection. No evidence of an intracranial mass. No midline shift. Vascular: No hyperdense vessel.  Atherosclerotic  calcifications. Skull: Normal. Negative for fracture or focal lesion. Sinuses/Orbits: Visualized orbits show no acute finding. No significant paranasal sinus disease at the imaged levels. ASPECTS ([place] Stroke Program Early CT Score) - Ganglionic level infarction (caudate, lentiform nuclei, internal capsule, insula, M1-M3 cortex): 7 - Supraganglionic infarction (M4-M6 cortex): 3 Total score (0-10 with 10 being normal): 10 (when discounting a chronic lacunar infarct within the right basal ganglia/internal capsule). No evidence of acute intracranial abnormality. These results were called by telephone at the time of interpretation on 04/03/2021 at 4:03 pm to provider [facility] , who verbally acknowledged these results. IMPRESSION: No evidence of acute intracranial abnormality. Redemonstrated chronic lacunar infarct within the right basal ganglia/right internal capsule. Background mild chronic small vessel ischemic changes within the cerebral white matter. Electronically Signed   By: Jackey Loge DO   On: 04/03/2021 16:04   CT HEAD CODE STROKE WO CONTRAST`  Result Date: 04/03/2021 CLINICAL DATA:  Code stroke. Neuro deficit, acute, stroke suspected and; unresponsive episode EXAM: CT HEAD WITHOUT CONTRAST TECHNIQUE: Contiguous axial images were obtained from the base of the skull through the vertex without intravenous contrast. COMPARISON:  2019 FINDINGS: Brain: No acute intracranial hemorrhage, mass effect, or edema. Gray-white differentiation is preserved. Patchy low-attenuation in the supratentorial white matter is nonspecific but may reflect mild chronic microvascular ischemic changes. Ventricles and sulci are normal in size and configuration. No extra-axial collection. Vascular: No hyperdense vessel or unexpected calcification. There is intracranial atherosclerotic calcification at the skull base. Skull: Unremarkable. Sinuses/Orbits: No acute finding. Other: Mastoid air cells are clear. ASPECTS (Alberta  Stroke Program Early CT Score) - Ganglionic level infarction (caudate, lentiform nuclei, internal capsule, insula, M1-M3 cortex): 7 - Supraganglionic infarction (M4-M6 cortex): 3 Total score (0-10 with 10 being normal): 10 IMPRESSION: There is no acute intracranial hemorrhage or evidence of acute infarction. ASPECT score is 10. These results were communicated to Dr. Iver Nestle at 3:54 pm on 04/03/2021 by text page via the [facility] messaging system. Electronically Signed   By: Guadlupe Spanish M.D.   On: 04/03/2021 15:56     CODE STATUS:     Code Status Orders  (From admission, onward)           Start     Ordered   04/03/21 1849  Full code  Continuous        04/03/21 1852           Code Status History     This patient has a current code status but no historical code status.        TOTAL TIME TAKING CARE OF THIS PATIENT: 35 minutes.    Enedina Finner M.D  [facility]    CC: Primary care physician; [facility]

## 2021-04-05 NOTE — Progress Notes (Signed)
PT Cancellation Note  Patient Details Name: Linda Delacruz MRN: [id number] DOB: 10-Jun-1966   Cancelled Treatment:    Reason Eval/Treat Not Completed: Other (comment) PT orders received. Physician d/c summary noted to be in & spoke with MD who reports pt is ambulatory in the room & no need for PT evaluation at this time. Will complete current PT orders, please re-consult if new needs arise.  Aleda Grana, PT, DPT 04/05/21, 8:08 AM    Sandi Mariscal 04/05/2021, 8:06 AM

## 2021-04-06 LAB — HEMOGLOBIN A1C
Hgb A1c MFr Bld: 15.4 % — ABNORMAL HIGH (ref 4.8–5.6)
Hgb A1c MFr Bld: >15.5 % — ABNORMAL HIGH (ref 4.8–5.6)
Mean Plasma Glucose: 395 mg/dL
Mean Plasma Glucose: >398 mg/dL

## 2021-04-06 LAB — LEVETIRACETAM LEVEL: Levetiracetam Lvl: <1 ug/mL — ABNORMAL LOW (ref 10.0–40.0)

## 2021-06-15 LAB — BLOOD GAS, VENOUS
Acid-base deficit: 0.6 mmol/L (ref 0.0–2.0)
Bicarbonate: 26.6 mmol/L (ref 20.0–28.0)
O2 Saturation: 35.3 %
Patient temperature: 37
pCO2, Ven: 54 mmHg (ref 44.0–60.0)
pH, Ven: 7.3 (ref 7.250–7.430)

## 2023-02-16 IMAGING — MR MR HEAD W/O CM
11 series · 41 of 48 positions shown · non-contrast
Comparison: Noncontrast head CT performed earlier today 04/03/2021.

CLINICAL DATA: Neuro deficit, acute, stroke suspected.

EXAM:
MRI HEAD WITHOUT CONTRAST
MRA HEAD WITHOUT CONTRAST
TECHNIQUE: Multiplanar, multi-echo pulse sequences of the brain and surrounding
structures were acquired without intravenous contrast. Angiographic
images of the Circle of Willis were acquired using MRA technique
without intravenous contrast.

[Series 5: ax dwi_tracew · axial · 3.0mm · 1.80mm/px · z∈[-116,+39]mm · 3 of 48 slices shown]
[im 1/48]
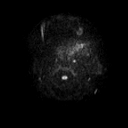
[im 24/48]
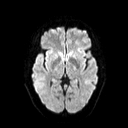
[im 48/48]
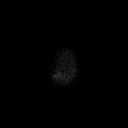

[Series 6: ax dwi_adc · axial · 3.0mm · 1.80mm/px · z∈[-116,+39]mm · 3 of 48 slices shown]
[im 1/48]
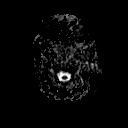
[im 24/48]
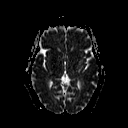
[im 48/48]
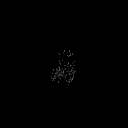

[Series 7: cor dwi_tracew · coronal · 5.0mm · 1.80mm/px · 3 of 38 slices shown]
[im 1/38]
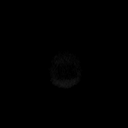
[im 19/38]
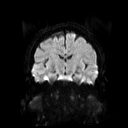
[im 38/38]
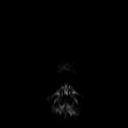

[Series 8: cor dwi_adc · coronal · 5.0mm · 1.80mm/px · 3 of 38 slices shown]
[im 1/38]
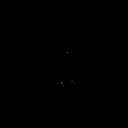
[im 19/38]
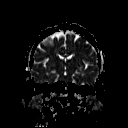
[im 38/38]
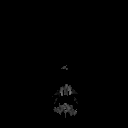

[Series 9: FLAIR · axial · 3.0mm · 0.69mm/px · z∈[-120,+42]mm · 4 of 55 slices shown (1 of 2)]
[im 1/55]
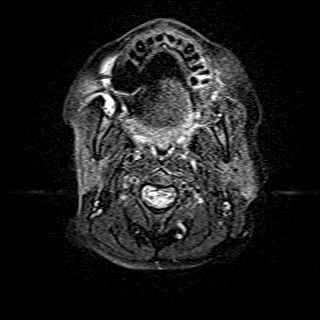
[im 19/55]
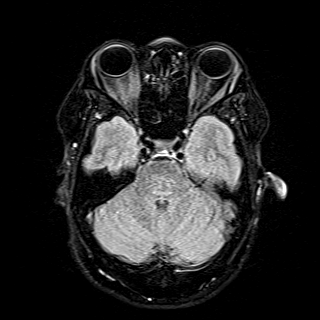
[im 37/55]
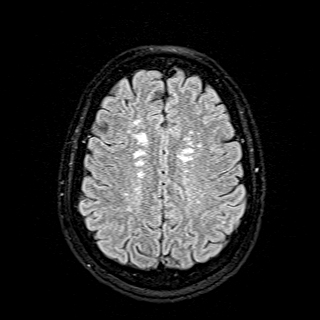
[im 55/55]
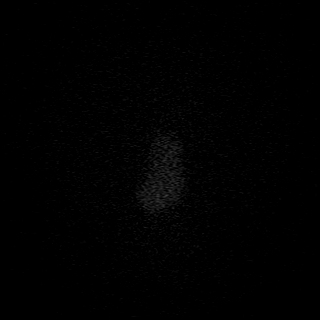

[Series 10: mag_images · axial · 3.0mm · 0.90mm/px · z∈[-115,+37]mm · 4 of 52 slices shown]
[im 1/52]
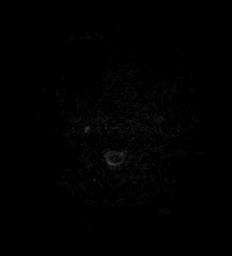
[im 18/52]
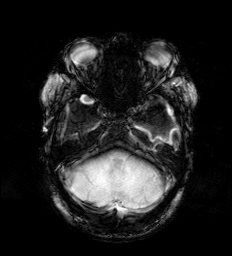
[im 35/52]
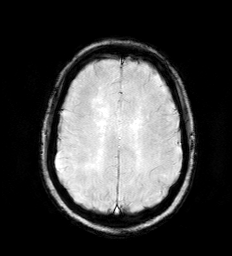
[im 52/52]
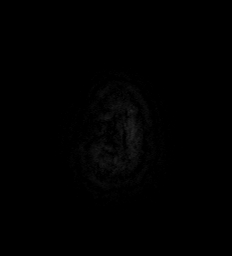

[Series 11: pha_images · axial · 3.0mm · 0.90mm/px · z∈[-115,+37]mm · 4 of 52 slices shown]
[im 1/52]
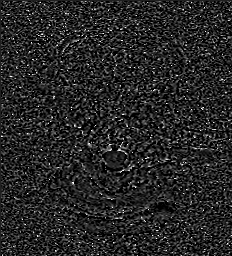
[im 18/52]
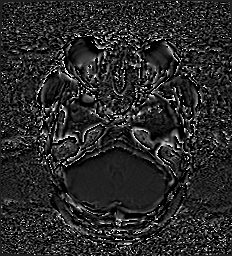
[im 35/52]
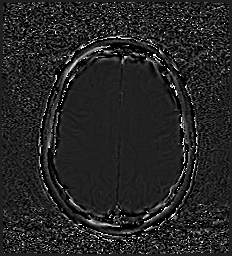
[im 52/52]
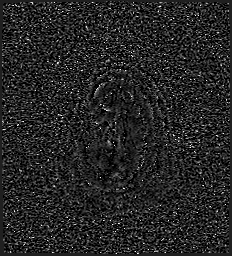

[Series 12: swi_images · axial · 3.0mm · 0.90mm/px · z∈[-115,+37]mm · 4 of 52 slices shown]
[im 1/52]
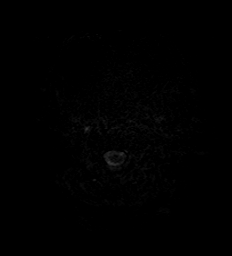
[im 18/52]
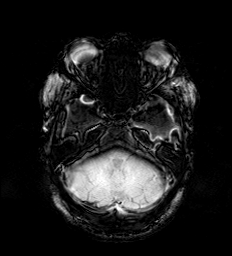
[im 35/52]
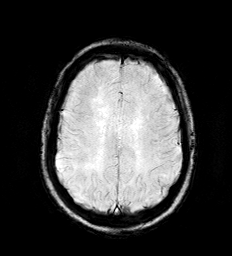
[im 52/52]
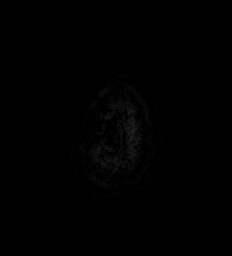

[Series 14: TOF · axial · 0.5mm · 0.41mm/px · z∈[-115,-14]mm · 8 of 205 slices shown]
[im 1/205]
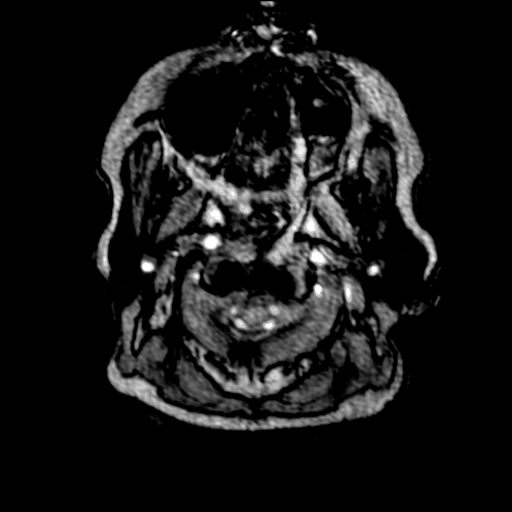
[im 30/205]
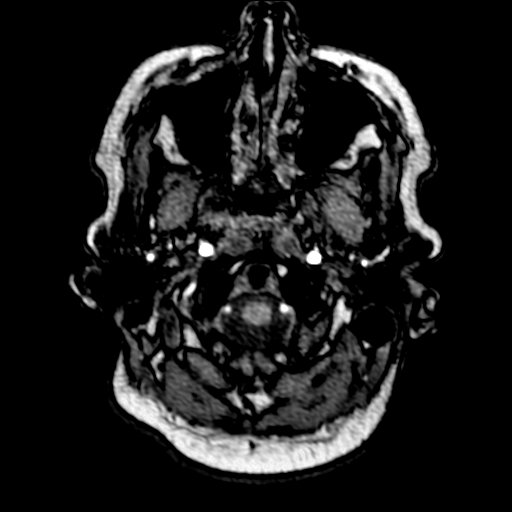
[im 59/205]
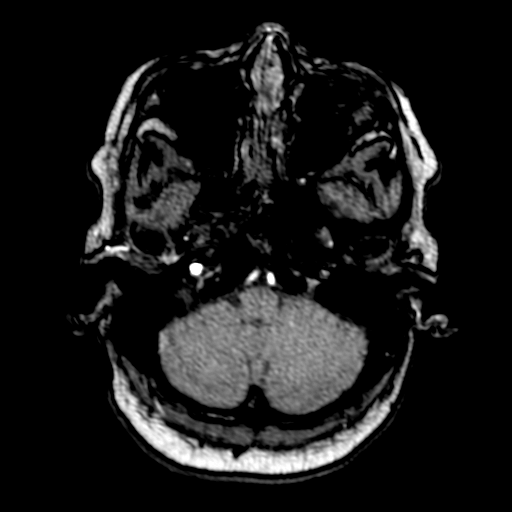
[im 88/205]
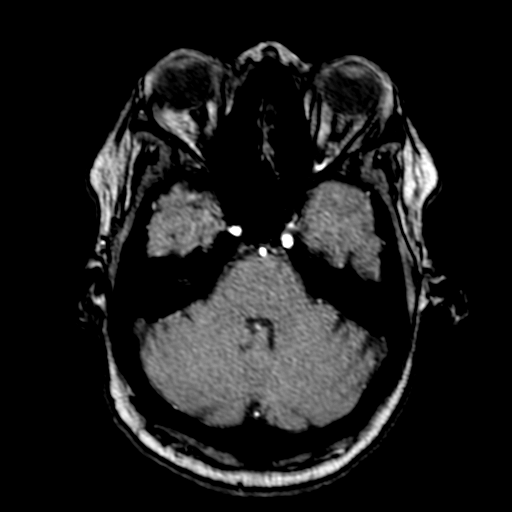
[im 117/205]
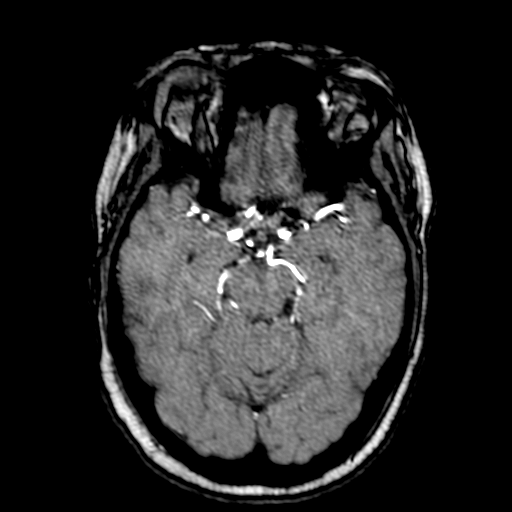
[im 146/205]
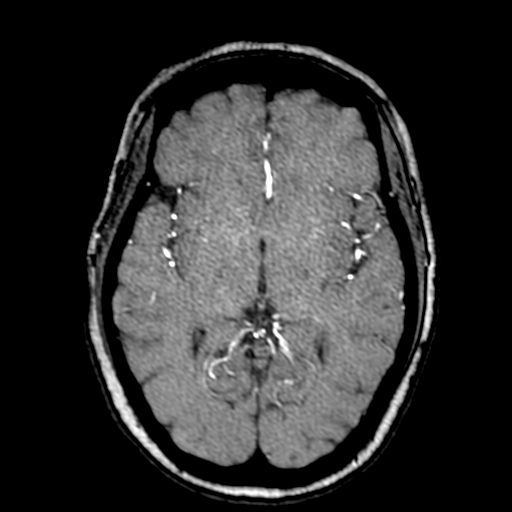
[im 175/205]
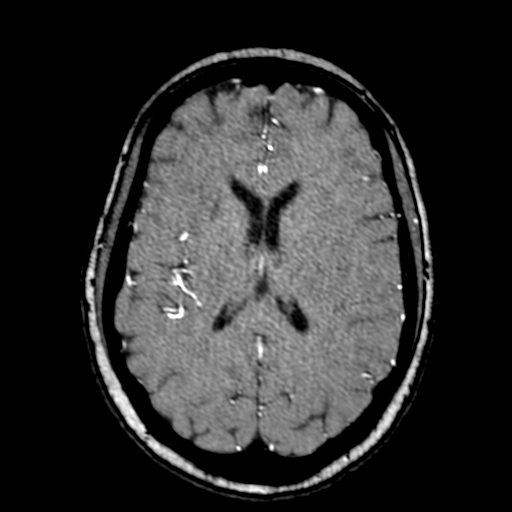
[im 205/205]
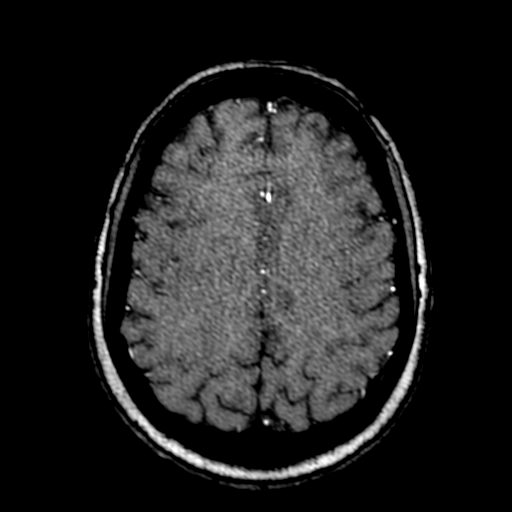

[Series 19: T2 · coronal · 3.0mm · 0.47mm/px · 3 of 35 slices shown]
[im 1/35]
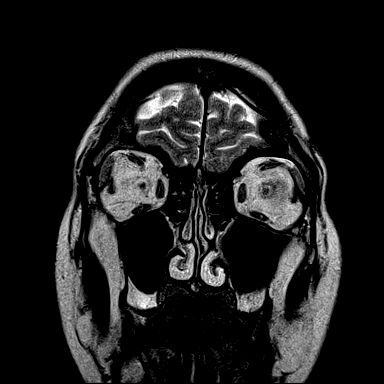
[im 18/35]
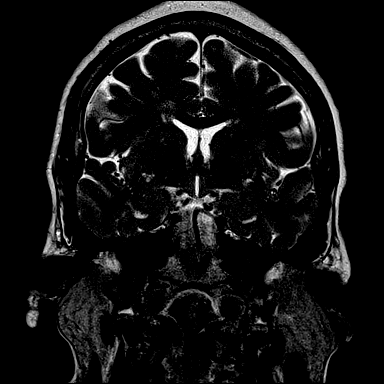
[im 35/35]
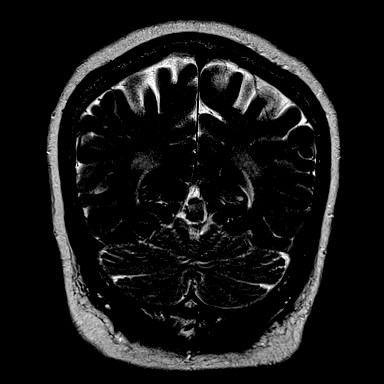

[Series 20: FLAIR · coronal · 3.0mm · 0.47mm/px · 2 of 33 slices shown (2 of 2)]
[im 1/33]
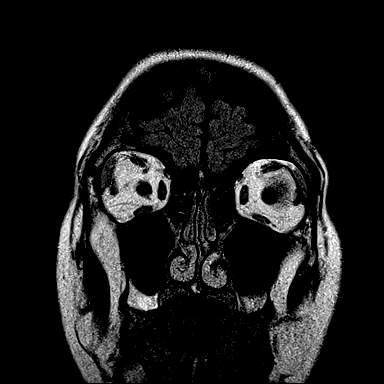
[im 33/33]
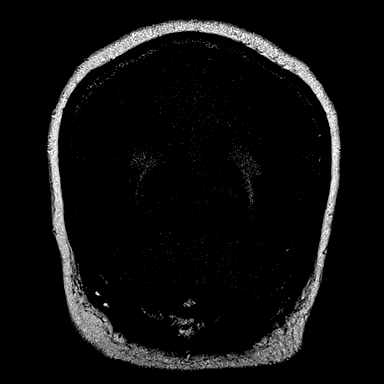

[41 of 48 positions shown; findings below may reference images not displayed]

FINDINGS: MRI HEAD FINDINGS

Brain:

The following protocol was performed at the ordering provider's
request: Axial coronal diffusion-weighted imaging, axial T2/FLAIR
sequence, axial SWI sequence, coronal and coronal T2/FLAIR sequences
oriented perpendicular to the long axis of the hippocampi.

Mild generalized cerebral and cerebellar atrophy.

Redemonstrated chronic lacunar infarct within the right basal
ganglia/anterior limb of right internal capsule.

Moderate multifocal T2/FLAIR hyperintensity within the cerebral
white matter and pons, nonspecific but most often secondary to
chronic small vessel ischemia.

The hippocampi are symmetric in size and signal.

There is no acute infarct.

No evidence of an intracranial mass.

No chronic intracranial blood products.

No extra-axial fluid collection.

No midline shift.

Vascular: Reported below.

Skull and upper cervical spine: No focal suspicious marrow lesion is
identified on the acquired sequences.

Sinuses/Orbits: Visualized orbits show no acute finding. No
significant paranasal sinus disease.

MRA HEAD FINDINGS

Anterior circulation:

Mildly motion degraded examination. The intracranial internal
carotid arteries are patent. Atherosclerotic irregularity of both
vessels. Up to moderate stenosis within the cavernous right ICA.
Apparent symmetric narrowing of the bilateral internal carotid
arteries at the level of the skull base is likely artifactual. The
M1 middle cerebral arteries are patent. Atherosclerotic irregularity
of the M2 and more distal middle cerebral artery vessels
bilaterally. Most notably, there is a severe stenosis within a
superior division proximal M2 right MCA vessel (series 2627, image
6). Also of note, there is up to moderate stenosis within a proximal
M2 left MCA vessel (series 2627, image 14). The anterior cerebral
arteries are patent. Mild stenosis within the proximal A1 right
anterior cerebral artery. 2 mm inferiorly projecting vascular
protrusion arising from the cavernous left ICA, which may reflect an
aneurysm (series 2627, image 18).

Posterior circulation:

The intracranial vertebral arteries are patent. The basilar artery
is patent. The posterior cerebral arteries are patent.
Atherosclerotic irregularity of both vessels. Most notably, there is
a moderate/severe focal stenosis within the distal P2 left PCA
(series 4141, image 3). Hypoplastic right P1 segment with sizable
right posterior communicating artery. A left posterior communicating
artery is also present.

Anatomic variants: As described
IMPRESSION: MRI brain:

1. No evidence of acute intracranial abnormality.
2. No specific seizure focus is identified.
3. Moderate T2/FLAIR hyperintense signal changes within the cerebral
white matter and pons, nonspecific but most often secondary to
chronic small vessel ischemia.
4. Redemonstrated chronic lacunar infarct within the right basal
ganglia/anterior limb of right internal capsule.
5. Mild generalized parenchymal atrophy.

MRA head:

1. Mildly motion degraded exam.
2. No intracranial large vessel occlusion is identified.
3. Intracranial atherosclerotic disease multifocal stenoses, most
notably as follows.
4. Up to moderate stenosis within the cavernous right ICA.
5. Moderate/severe stenosis within a superior division proximal M2
right MCA vessel.
6. Moderate stenosis within a superior division proximal M2 left MCA
vessel.
7. Moderate/severe focal stenosis within the distal P2 segment of
the left posterior cerebral artery.
8. 2 mm inferiorly projecting vascular protrusion arising from the
cavernous left ICA, which may reflect an aneurysm.

## 2023-02-16 IMAGING — CT CT HEAD CODE STROKE
3 series · 15 of 33 positions shown, 18 images · non-contrast
Comparison: Head CT 10/06/2017.

CLINICAL DATA: Neuro deficit, acute, stroke suspected. Additional
history provided: Patient unable to stand with EMS, witnessed
episode of unresponsiveness, patient nonverbal since that time.

EXAM:
CT HEAD WITHOUT CONTRAST
TECHNIQUE: Contiguous axial images were obtained from the base of the skull
through the vertex without intravenous contrast.

[Series 3: head wo · axial · 0.41mm/px · z∈[-166,-46]mm · 7 of 30 slices shown, 9 images]
[im 3/30  soft-tissue]
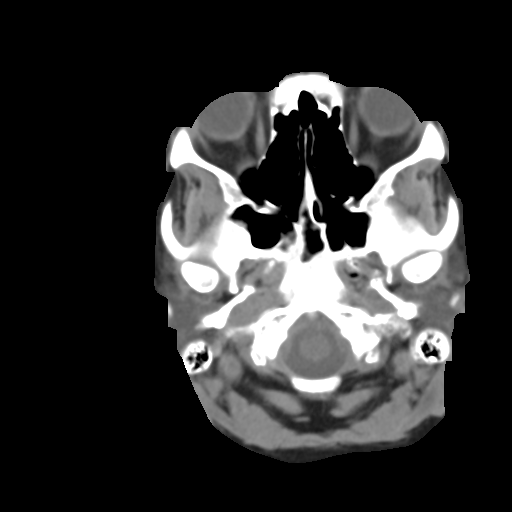
[im 3/30  bone]
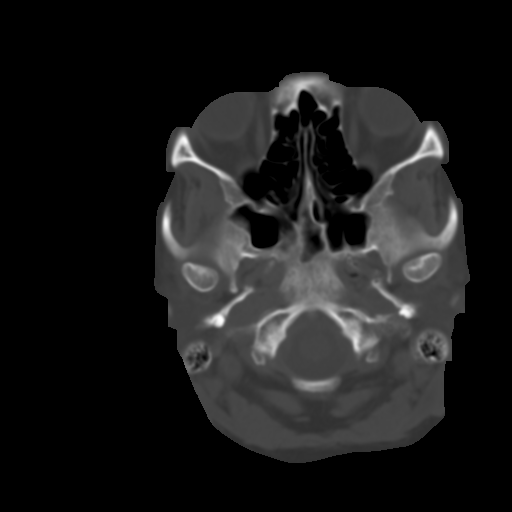
[im 7/30  bone]
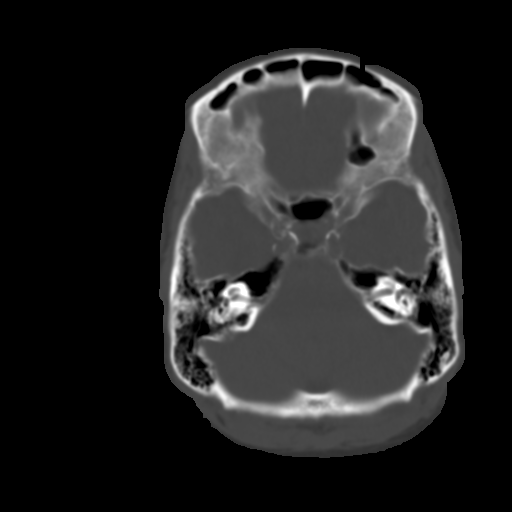
[im 12/30  bone]
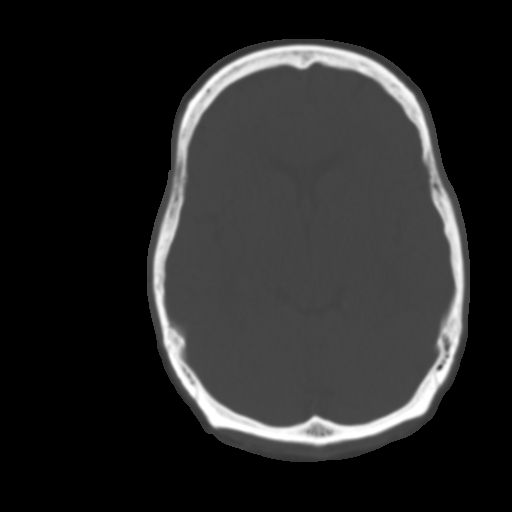
[im 16/30  bone]
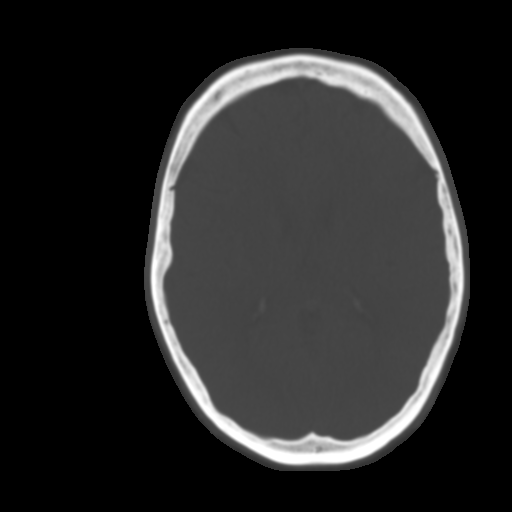
[im 18/30  soft-tissue]
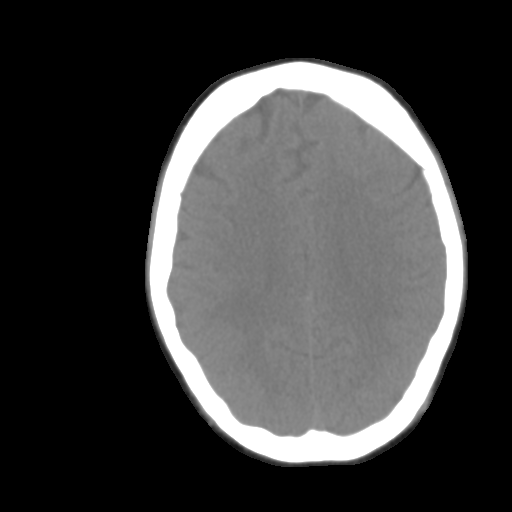
[im 18/30  bone]
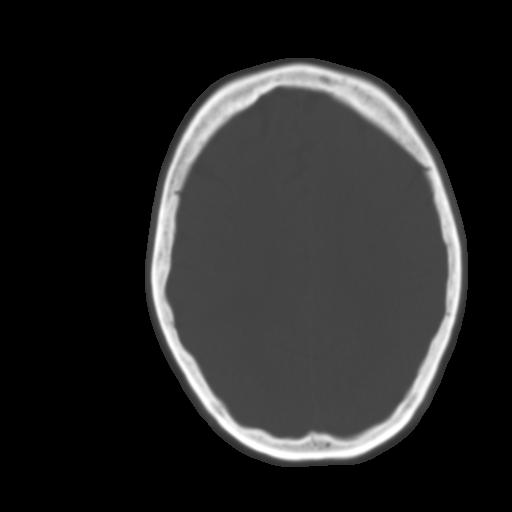
[im 23/30  bone]
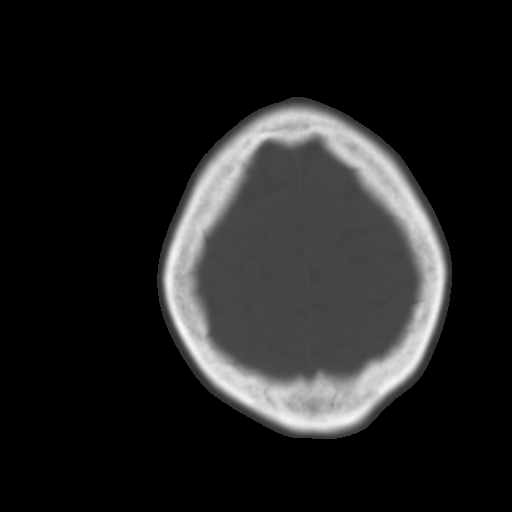
[im 27/30  bone]
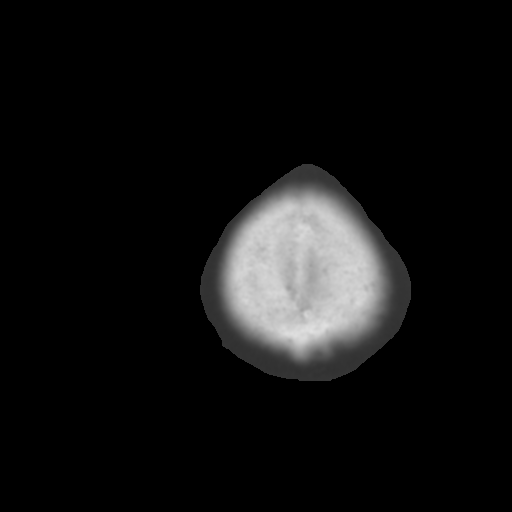

[Series 6: coronal soft tissue · coronal · 0.31mm/px · 3 of 63 slices shown]
[im 14/63  bone]
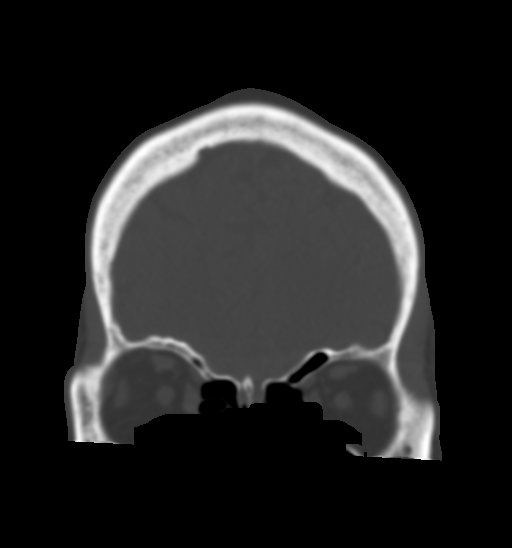
[im 26/63  bone]
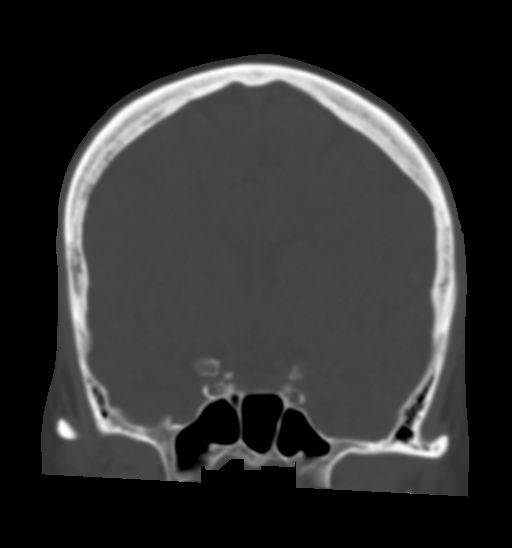
[im 38/63  bone]
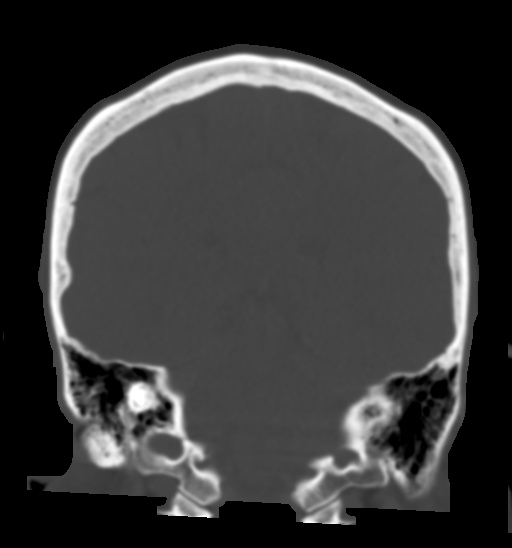

[Series 7: sagittal soft tissue · sagittal · 0.33mm/px · 5 of 53 slices shown, 6 images]
[im 18/53  bone]
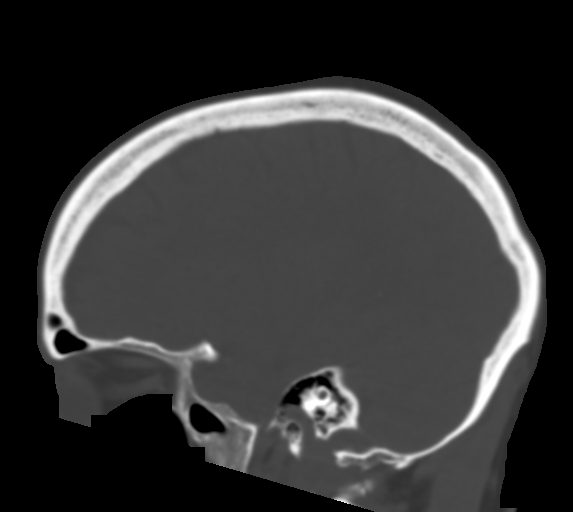
[im 22/53  bone]
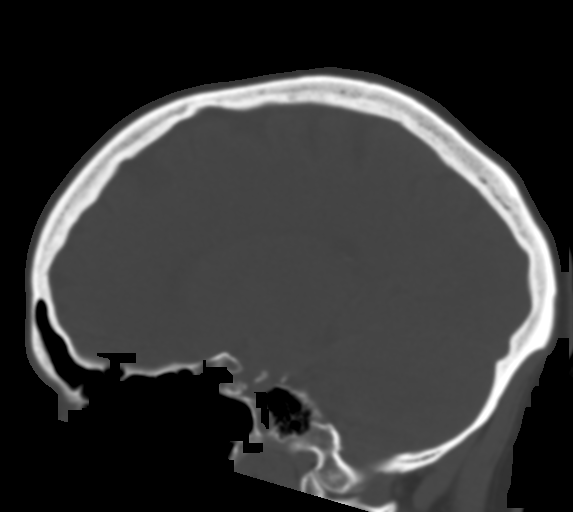
[im 27/53  soft-tissue]
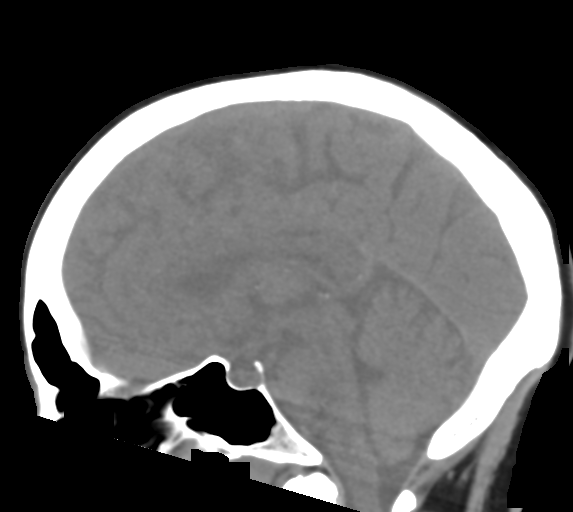
[im 27/53  bone]
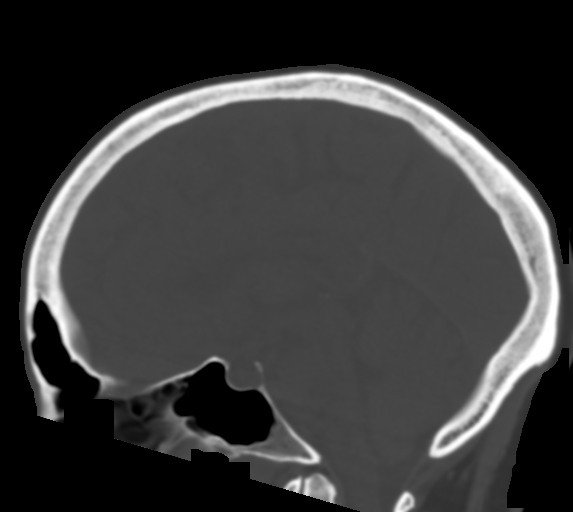
[im 31/53  bone]
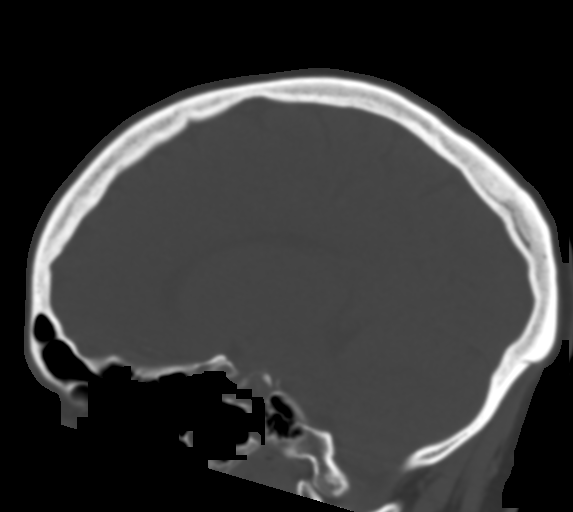
[im 35/53  bone]
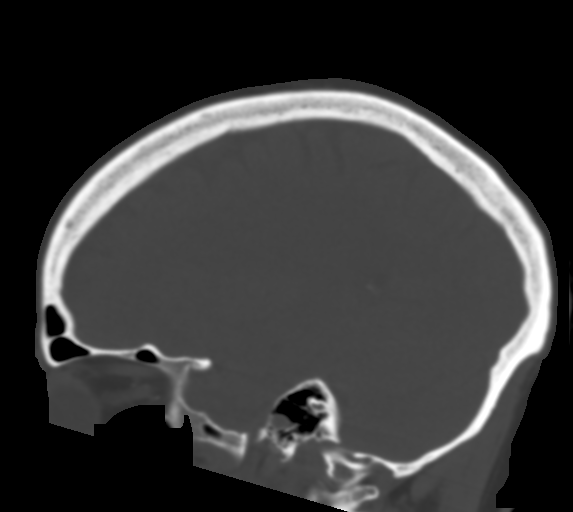

[15 of 33 positions shown; findings below may reference images not displayed]

FINDINGS: Brain:

Cerebral volume is normal for age.

Redemonstrated chronic lacunar infarct within the right basal
ganglia/anterior limb of right internal capsule.

Background mild patchy and ill-defined hypoattenuation within the
cerebral white matter, nonspecific but compatible with chronic small
vessel ischemic disease.

There is no acute intracranial hemorrhage.

No demarcated cortical infarct.

No extra-axial fluid collection.

No evidence of an intracranial mass.

No midline shift.

Vascular: No hyperdense vessel.  Atherosclerotic calcifications.

Skull: Normal. Negative for fracture or focal lesion.

Sinuses/Orbits: Visualized orbits show no acute finding. No
significant paranasal sinus disease at the imaged levels.

ASPECTS ([place] Stroke Program Early CT Score)

- Ganglionic level infarction (caudate, lentiform nuclei, internal
capsule, insula, M1-M3 cortex): 7

- Supraganglionic infarction (M4-M6 cortex): 3

Total score (0-10 with 10 being normal): 10 (when discounting a
chronic lacunar infarct within the right basal ganglia/internal
capsule).

No evidence of acute intracranial abnormality. These results were
called by telephone at the time of interpretation on 04/03/2021 at
[DATE] to provider HANSA SCHOLTEN , who verbally acknowledged these
results.
IMPRESSION: No evidence of acute intracranial abnormality.

Redemonstrated chronic lacunar infarct within the right basal
ganglia/right internal capsule.

Background mild chronic small vessel ischemic changes within the
cerebral white matter.
